# Patient Record
Sex: Female | Born: 1985 | Race: Black or African American | Hispanic: No | Marital: Single | Smoking: Never smoker
Health system: Southern US, Community
[De-identification: ages and names within clinical notes are randomized; demographics above are authoritative.]

## PROBLEM LIST (undated history)

## (undated) DIAGNOSIS — D649 Anemia, unspecified: Secondary | ICD-10-CM

## (undated) DIAGNOSIS — K219 Gastro-esophageal reflux disease without esophagitis: Secondary | ICD-10-CM

## (undated) DIAGNOSIS — N390 Urinary tract infection, site not specified: Secondary | ICD-10-CM

## (undated) DIAGNOSIS — G43909 Migraine, unspecified, not intractable, without status migrainosus: Secondary | ICD-10-CM

## (undated) DIAGNOSIS — F319 Bipolar disorder, unspecified: Secondary | ICD-10-CM

## (undated) DIAGNOSIS — B009 Herpesviral infection, unspecified: Secondary | ICD-10-CM

## (undated) DIAGNOSIS — F32A Depression, unspecified: Secondary | ICD-10-CM

## (undated) DIAGNOSIS — F329 Major depressive disorder, single episode, unspecified: Secondary | ICD-10-CM

## (undated) DIAGNOSIS — R768 Other specified abnormal immunological findings in serum: Secondary | ICD-10-CM

## (undated) HISTORY — DX: Urinary tract infection, site not specified: N39.0

## (undated) HISTORY — DX: Other specified abnormal immunological findings in serum: R76.8

## (undated) HISTORY — DX: Gastro-esophageal reflux disease without esophagitis: K21.9

## (undated) HISTORY — DX: Major depressive disorder, single episode, unspecified: F32.9

## (undated) HISTORY — DX: Depression, unspecified: F32.A

## (undated) HISTORY — DX: Anemia, unspecified: D64.9

## (undated) HISTORY — DX: Morbid (severe) obesity due to excess calories: E66.01

## (undated) HISTORY — DX: Migraine, unspecified, not intractable, without status migrainosus: G43.909

---
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
## 2007-01-02 HISTORY — PX: DILATION AND CURETTAGE OF UTERUS: SHX78

## 2007-01-10 ENCOUNTER — Encounter (INDEPENDENT_AMBULATORY_CARE_PROVIDER_SITE_OTHER): Payer: Self-pay | Admitting: Obstetrics and Gynecology

## 2007-01-10 ENCOUNTER — Ambulatory Visit (HOSPITAL_COMMUNITY): Admission: AD | Admit: 2007-01-10 | Discharge: 2007-01-10 | Payer: Self-pay | Admitting: Obstetrics and Gynecology

## 2008-03-18 ENCOUNTER — Inpatient Hospital Stay (HOSPITAL_COMMUNITY): Admission: AD | Admit: 2008-03-18 | Discharge: 2008-03-18 | Payer: Self-pay | Admitting: Obstetrics and Gynecology

## 2008-04-19 ENCOUNTER — Inpatient Hospital Stay (HOSPITAL_COMMUNITY): Admission: AD | Admit: 2008-04-19 | Discharge: 2008-04-23 | Payer: Self-pay | Admitting: Obstetrics and Gynecology

## 2008-04-20 ENCOUNTER — Encounter (INDEPENDENT_AMBULATORY_CARE_PROVIDER_SITE_OTHER): Payer: Self-pay | Admitting: Obstetrics and Gynecology

## 2008-04-30 ENCOUNTER — Inpatient Hospital Stay (HOSPITAL_COMMUNITY): Admission: AD | Admit: 2008-04-30 | Discharge: 2008-04-30 | Payer: Self-pay | Admitting: Obstetrics and Gynecology

## 2008-05-11 ENCOUNTER — Emergency Department (HOSPITAL_COMMUNITY): Admission: EM | Admit: 2008-05-11 | Discharge: 2008-05-11 | Payer: Self-pay | Admitting: Emergency Medicine

## 2009-08-16 ENCOUNTER — Ambulatory Visit: Payer: Self-pay | Admitting: Family

## 2009-08-16 DIAGNOSIS — F319 Bipolar disorder, unspecified: Secondary | ICD-10-CM

## 2009-08-17 ENCOUNTER — Telehealth (INDEPENDENT_AMBULATORY_CARE_PROVIDER_SITE_OTHER): Payer: Self-pay | Admitting: *Deleted

## 2009-08-18 ENCOUNTER — Telehealth: Payer: Self-pay | Admitting: Family

## 2009-08-18 ENCOUNTER — Encounter: Payer: Self-pay | Admitting: Family

## 2009-08-21 ENCOUNTER — Encounter: Payer: Self-pay | Admitting: Family

## 2009-08-22 ENCOUNTER — Telehealth: Payer: Self-pay | Admitting: Family

## 2009-08-30 ENCOUNTER — Ambulatory Visit: Payer: Self-pay | Admitting: Family

## 2009-08-30 ENCOUNTER — Other Ambulatory Visit: Admission: RE | Admit: 2009-08-30 | Discharge: 2009-08-30 | Payer: Self-pay | Admitting: Internal Medicine

## 2009-08-30 DIAGNOSIS — G43009 Migraine without aura, not intractable, without status migrainosus: Secondary | ICD-10-CM | POA: Insufficient documentation

## 2009-08-30 DIAGNOSIS — K219 Gastro-esophageal reflux disease without esophagitis: Secondary | ICD-10-CM | POA: Insufficient documentation

## 2009-08-30 DIAGNOSIS — K589 Irritable bowel syndrome without diarrhea: Secondary | ICD-10-CM | POA: Insufficient documentation

## 2009-08-30 LAB — HM PAP SMEAR: HM Pap smear: NORMAL

## 2009-09-01 ENCOUNTER — Ambulatory Visit: Payer: Self-pay | Admitting: Gastroenterology

## 2009-09-01 LAB — CONVERTED CEMR LAB
LDL Cholesterol: 78 mg/dL (ref 0–99)
Total CHOL/HDL Ratio: 2

## 2009-09-04 ENCOUNTER — Ambulatory Visit: Payer: Self-pay | Admitting: Psychiatry

## 2009-09-04 ENCOUNTER — Ambulatory Visit: Payer: Self-pay | Admitting: Family

## 2009-09-04 ENCOUNTER — Telehealth (INDEPENDENT_AMBULATORY_CARE_PROVIDER_SITE_OTHER): Payer: Self-pay | Admitting: *Deleted

## 2009-09-04 DIAGNOSIS — J020 Streptococcal pharyngitis: Secondary | ICD-10-CM

## 2009-09-04 LAB — CONVERTED CEMR LAB
BUN: 7 mg/dL (ref 6–23)
Basophils Absolute: 0 10*3/uL (ref 0.0–0.1)
Basophils Relative: 0.7 % (ref 0.0–3.0)
CO2: 27 meq/L (ref 19–32)
Chloride: 101 meq/L (ref 96–112)
Creatinine, Ser: 0.5 mg/dL (ref 0.4–1.2)
Eosinophils Absolute: 0.2 10*3/uL (ref 0.0–0.7)
Glucose, Bld: 86 mg/dL (ref 70–99)
Lymphocytes Relative: 39.3 % (ref 12.0–46.0)
MCHC: 32.7 g/dL (ref 30.0–36.0)
Monocytes Absolute: 0.5 10*3/uL (ref 0.1–1.0)
Neutrophils Relative %: 49.6 % (ref 43.0–77.0)
Platelets: 251 10*3/uL (ref 150.0–400.0)
RBC: 4.97 M/uL (ref 3.87–5.11)
RDW: 17.8 % — ABNORMAL HIGH (ref 11.5–14.6)
Sodium: 133 meq/L — ABNORMAL LOW (ref 135–145)
TSH: 0.85 microintl units/mL (ref 0.35–5.50)

## 2009-09-05 LAB — CONVERTED CEMR LAB

## 2009-09-06 ENCOUNTER — Encounter: Payer: Self-pay | Admitting: Family

## 2009-09-25 ENCOUNTER — Ambulatory Visit: Payer: Self-pay | Admitting: Psychiatry

## 2009-10-02 ENCOUNTER — Ambulatory Visit: Payer: Self-pay | Admitting: Psychiatry

## 2009-10-18 ENCOUNTER — Ambulatory Visit: Payer: Self-pay | Admitting: Gastroenterology

## 2009-10-25 ENCOUNTER — Telehealth: Payer: Self-pay | Admitting: Family

## 2009-11-22 HISTORY — PX: INDUCED ABORTION: SHX677

## 2009-11-27 ENCOUNTER — Ambulatory Visit: Payer: Self-pay | Admitting: Family

## 2009-12-13 ENCOUNTER — Telehealth: Payer: Self-pay | Admitting: Family

## 2009-12-15 ENCOUNTER — Encounter (INDEPENDENT_AMBULATORY_CARE_PROVIDER_SITE_OTHER): Payer: Self-pay | Admitting: *Deleted

## 2009-12-15 ENCOUNTER — Ambulatory Visit: Payer: Self-pay | Admitting: Internal Medicine

## 2009-12-15 LAB — CONVERTED CEMR LAB
Bilirubin Urine: NEGATIVE
Nitrite: NEGATIVE
Specific Gravity, Urine: 1.01
WBC Urine, dipstick: NEGATIVE
pH: 7.5

## 2010-01-17 ENCOUNTER — Encounter: Payer: Self-pay | Admitting: Family

## 2010-01-31 ENCOUNTER — Telehealth: Payer: Self-pay | Admitting: Family

## 2010-02-12 ENCOUNTER — Telehealth: Payer: Self-pay | Admitting: Family

## 2010-02-16 ENCOUNTER — Ambulatory Visit: Payer: Self-pay | Admitting: Family

## 2010-02-16 DIAGNOSIS — N390 Urinary tract infection, site not specified: Secondary | ICD-10-CM

## 2010-02-16 LAB — CONVERTED CEMR LAB
Glucose, Urine, Semiquant: NEGATIVE
Protein, U semiquant: 30
pH: 6

## 2010-02-17 ENCOUNTER — Encounter: Payer: Self-pay | Admitting: Family

## 2010-02-17 LAB — CONVERTED CEMR LAB: Gardnerella vaginalis: POSITIVE — AB

## 2010-02-19 ENCOUNTER — Telehealth: Payer: Self-pay | Admitting: Family

## 2010-04-23 ENCOUNTER — Ambulatory Visit: Payer: Self-pay | Admitting: Family

## 2010-04-23 DIAGNOSIS — J029 Acute pharyngitis, unspecified: Secondary | ICD-10-CM

## 2010-04-23 LAB — CONVERTED CEMR LAB
Inflenza A Ag: NEGATIVE
Rapid Strep: NEGATIVE

## 2010-07-03 NOTE — Progress Notes (Signed)
Summary: birth control questions  Phone Note Call from Patient Call back at [facility] # [phone]   Caller: Patient Call For: Lemont Fillers FNP Summary of Call: Received call from pt stating she can't remember her last menstrual cycle. Thinks it has been about 2 months. Wants to know if this is normal to not have any bleeding while on oral contraceptive? Melanie Christian CMA Duncan Dull)  February 12, 2010 3:29 PM   Follow-up for Phone Call        She should bleed during the last week in the pack (week 4).  Please schedule a nurse visit for urine HCG. Follow-up by: Lemont Fillers FNP,  February 12, 2010 3:49 PM  Additional Follow-up for Phone Call Additional follow up Details #1::        Advised pt per [facility] instruction. Pt scheduled nurse visit for 02/16/10 @ 1:30pm. Mervin Kung CMA (AAMA)  February 12, 2010 4:26 PM

## 2010-07-03 NOTE — Progress Notes (Signed)
Summary: change birth control  Phone Note Call from Patient Call back at [phone]   Caller: Patient Call For: Lemont Fillers FNP Summary of Call: Pt states she was told in the past that she should not take a hormonal birth control because of possible effects on migraines. States that she is taking Excedrin Migraine every day and it usually helps but is not helping today.  Also states that she had an elevated BP on "Sunday of 130/90 adn was feeling dizzy.  Should she change birth control?  Please advise.  Tricia Fergerson CMA (AAMA)  December 13, 2009 11:21 AM   Follow-up for Phone Call        Called patient, she has had migraine since Sunday- Excedrin Migraine is not working.  Will try imitrex.  Pt instructed to call if this does not improve her HA as she will need to be seen in the office.  She had mirena IUD in the past but she was intolerant to it and it was removed.  Continue OCP for now. Follow-up by: Imran Nuon S O'Sullivan FNP,  December 13, 2009 11:50 AM    New/Updated Medications: IMITREX 50 MG TABS (SUMATRIPTAN SUCCINATE) one tablet by mouth now, may repeat in 2 hours x one additional dose if HA returns. Prescriptions: IMITREX 50 MG TABS (SUMATRIPTAN SUCCINATE) one tablet by mouth now, may repeat in 2 hours x one additional dose if HA returns.  #6 x 0   Entered and Authorized by:   Rhyan Radler S O'Sullivan FNP   Signed by:   Jenene Kauffmann S O'Sullivan FNP on 12/13/2009   Method used:   Electronically to        [facility] [address] (retail)       [address]       [place], [place]  [id number]       Ph: [phone]       Fax: [phone]   RxID:   [id number]

## 2010-07-03 NOTE — Progress Notes (Signed)
Summary: Lo loestrin alternative  Phone Note Call from Patient   Caller: Patient Call For: Lemont Fillers FNP Summary of Call: Received voice message from pt stating that her insurance will no longer cover lo loestrin. Pt would like [place] to prescribe something else with a low hormone level. Please advise. Nicki Guadalajara Fergerson CMA Duncan Dull)  January 31, 2010 4:20 PM   Follow-up for Phone Call        I spoke to [place] with [facility], he verified that med is not covered but he was not able to tell me other covered alternatives. Please advise. Nicki Guadalajara Fergerson CMA Duncan Dull)  January 31, 2010 4:24 PM   Additional Follow-up for Phone Call Additional follow up Details #1::        She can try micrgestin 1/20.  This is the lowest generic that I am aware of.  Sent to pharmacy. Additional Follow-up by: Lemont Fillers FNP,  January 31, 2010 4:28 PM    Additional Follow-up for Phone Call Additional follow up Details #2::    Pt advised of new med. and will call if any problems. Nicki Guadalajara Fergerson CMA Duncan Dull)  February 01, 2010 8:38 AM   New/Updated Medications: MICROGESTIN FE 1/20 1-20 MG-MCG TABS (NORETHIN ACE-ETH ESTRAD-FE) take as directed Prescriptions: MICROGESTIN FE 1/20 1-20 MG-MCG TABS (NORETHIN ACE-ETH ESTRAD-FE) take as directed  #1 x 5   Entered and Authorized by:   Lemont Fillers FNP   Signed by:   Lemont Fillers FNP on 01/31/2010   Method used:   Electronically to        [facility] [address] (retail)       [address]       [place], [place]  [address]       Ph: [phone]       Fax: [phone]   RxID:   [id number]

## 2010-07-03 NOTE — Medication Information (Signed)
Summary: Prior Authorization for Cymbalta/[employer]  Prior Authorization for Cymbalta/[employer]   Imported By: Lanelle Bal 08/23/2009 09:52:37  _____________________________________________________________________  External Attachment:    Type:   Image     Comment:   External Document

## 2010-07-03 NOTE — Progress Notes (Signed)
Summary: pregnancy kit  Phone Note Call from Patient Call back at [phone]   Caller: Patient Summary of Call: Pt called stating she was 1 week overdue for her menstrual cycle.  Wanted to know when she should do a pregnancy test.  Per Gracey Tolle, pt can do a test now; should detect pregnancy if she is 1 day overdue for her cycle. Notified pt.  Mervin Kung CMA  Oct 25, 2009 3:53 PM

## 2010-07-03 NOTE — Progress Notes (Signed)
Summary: Cymbalta prior auth  Phone Note Call from Patient   Caller: Patient Summary of Call: Pt. states that her pharmacy will not give her med. that Melissa prescribed until we call insurance for a prior authorization. Call pt. [phone] Pt. has no medicine to take and was seen yesterday Initial call taken by: Michaelle Copas,  August 17, 2009 3:50 PM  Follow-up for Phone Call        call placed to patient at [phone], no answer, voice message left informing patient that samples would left at front desk for patient pick up until we hear from her pharmacy regarding prior authorization Follow-up by: Glendell Docker CMA,  August 17, 2009 4:31 PM  Additional Follow-up for Phone Call Additional follow up Details #1::        her pharmacy is stating that they faxed a prior authorization request yesterday at 3:26.. Pt would like for [place] to get this resolved so she doesn't have to go back and forth between [place]. Pt. also like for this to go ahead be sent to her pharmacy because that is closer for her to get med because our office is further from her home.Call pt. back and let her know status on this. Thank you Additional Follow-up by: Michaelle Copas,  August 17, 2009 4:37 PM    Additional Follow-up for Phone Call Additional follow up Details #2::    Spoke to pt. and advised her we have samples she can pick up at the [facility] office today until medication is approved with the insurance.  Form was completed and faxed to [facility]. today. Follow-up by: Mervin Kung CMA,  August 18, 2009 10:55 AM

## 2010-07-03 NOTE — Letter (Signed)
Summary: Primary Care Consult Scheduled Letter  Sharon at [facility]  [address]   [place], [place] [address]   Phone: [phone]  Fax: [phone]      12/15/2009 MRN: [id number]  Melanie Christian [address] [place], [place]  [address]    Dear Ms. Panther,      We have scheduled an appointment for you.  At the recommendation of Dr.YOO, we have scheduled you a consult with DR Mitzi Davenport on January 17, 2010 at 2:15PM .  Their address is_[address] ,[place] . The office phone number is [phone].  If this appointment day and time is not convenient for you, please feel free to call the office of the doctor you are being referred to at the number listed above and reschedule the appointment.     It is important for you to keep your scheduled appointments. We are here to make sure you are given good patient care. If you have questions or you have made changes to your appointment, please notify us at  [phone], ask for HELEN.    Thank you,  Patient Care Coordinator [place] at [facility]

## 2010-07-03 NOTE — Letter (Signed)
Summary: Out of Work  Adult nurse at [facility]. [address]   [place], [place] [address]   Phone: [phone]  Fax: [phone]    September 04, 2009   Employee:  MICKEY HEBEL    To Whom It May Concern:   For Medical reasons, please excuse the above named employee from work for the following dates:  Start:   4/4  End:   4/7  If you need additional information, please feel free to contact our office.         Sincerely,    Lemont Fillers FNP

## 2010-07-03 NOTE — Assessment & Plan Note (Signed)
Summary: SORE THROAT LARINGITIS/MHF   Vital Signs:  Patient profile:   25 year old female Height:      62 inches Weight:      253.75 pounds BMI:     46.58 Temp:     98.6 degrees F oral Pulse rate:   84 / minute Pulse rhythm:   regular Resp:     18 per minute BP sitting:   110 / 78  (right arm) Cuff size:   large  Vitals Entered By: Mervin Kung CMA Duncan Dull) (April 23, 2010 8:33 AM) CC: Pt states she has sore throat x 1 week and getting worse. Has cough x 3 days, neck and arms hurt.  Is Patient Diabetic? No Comments Pt states psychologist stopped Amitriptyline and started her on ?? Pt also taking Vyvanse 60mg  1 once daily. Pt completed cipro and Metrogel. Nicki Guadalajara Fergerson CMA Duncan Dull)  April 23, 2010 8:43 AM    Primary Care Provider:  Tonie Griffith FNP  CC:  Pt states she has sore throat x 1 week and getting worse. Has cough x 3 days and neck and arms hurt. .  History of Present Illness: Patient presents with chief complaint of sore throat.  Onset of symptoms was 1 week ago.  Course has been gradual with initial "scratchy" throat, and now occurs in a constant pattern.  Symptoms are associated with myalgia and dry cough.  She denies sick contacts.  Pt did not have a flu shot this year.  Symptoms are worsened by nothing, symptoms are improved by nothing.   Depression-  see Dr. Otelia Santee, depression is improved,  develops nausea with her medications.  Sometimes doesn't take meds due to nausea.     Allergies: 1)  ! Vicodin 2)  ! Percocet  Past History:  Past Medical History: Last updated: 12/15/2009 anemia Depression  Migraines Gerd UTI morbid obesity  Past Surgical History: Last updated: 12/15/2009 Tonsillectomy--1996 C-Section--11-09 D&C-- 8/08 Elective Abortion--11/22/09   Review of Systems       see HPI Denies ear pain or nasal congestion.  Physical Exam  General:  Well-developed,well-nourished,in no acute distress; alert,appropriate and cooperative  throughout examination Head:  Normocephalic and atraumatic without obvious abnormalities. No apparent alopecia or balding. Eyes:  No corneal or conjunctival inflammation noted. EOMI. Perrla. Funduscopic exam benign, without hemorrhages, exudates or papilledema. Vision grossly normal. Ears:  L TM is dull, no bulging Mouth:  Oral mucosa and oropharynx without lesions or exudates.  Teeth in good repair.  + pharyngeal erythema.   Lungs:  Normal respiratory effort, chest expands symmetrically. Lungs are clear to auscultation, no crackles or wheezes. Heart:  Normal rate and regular rhythm. S1 and S2 normal without gallop, murmur, click, rub or other extra sounds. Cervical Nodes:  + LAD Psych:  Cognition and judgment appear intact. Alert and cooperative with normal attention span and concentration. No apparent delusions, illusions, hallucinations   Impression & Recommendations:  Problem # 1:  PHARYNGITIS, ACUTE (ICD-462) Assessment Comment Only  Rapid strep is negative, but will plan to treat empirically for strep given severity of throat pain and LAD.  Flu swab was negative.   The following medications were removed from the medication list:    Cipro 500 Mg Tabs (Ciprofloxacin hcl) ..... One tablet by mouth two times a day x 7 days Her updated medication list for this problem includes:    Amoxicillin 500 Mg Caps (Amoxicillin) .Marland Kitchen... 2 caps by mouth two times a day for 10 days  The following medications were  removed from the medication list:    Cipro 500 Mg Tabs (Ciprofloxacin hcl) ..... One tablet by mouth two times a day x 7 days Her updated medication list for this problem includes:    Amoxicillin 500 Mg Caps (Amoxicillin) .Marland Kitchen... 2 caps by mouth two times a day for 10 days  Problem # 2:  DEPRESSION (ICD-311) Assessment: Improved Overall improved.  This is being managed by psychiatry.   The following medications were removed from the medication list:    Amitriptyline Hcl 10 Mg Tabs  (Amitriptyline hcl) ..... One by mouth qpm  Complete Medication List: 1)  Excedrin Migraine 250-250-65 Mg Tabs (Aspirin-acetaminophen-caffeine) .... As needed 2)  Nexium 40 Mg Cpdr (Esomeprazole magnesium) .Marland Kitchen.. 1 by mouth once daily, 20-30 min before dinner meal 3)  Microgestin Fe 1/20 1-20 Mg-mcg Tabs (Norethin ace-eth estrad-fe) .... Take as directed 4)  Frova 2.5 Mg Tabs (Frovatriptan succinate) .... One by mouth once daily as needed for severe migraine 5)  Vyvanse 60 Mg Caps (Lisdexamfetamine dimesylate) .... Take 1 capsule by mouth once a day. 6)  Amoxicillin 500 Mg Caps (Amoxicillin) .... 2 caps by mouth two times a day for 10 days 7)  Effexor Ir 75mg   .... Take 1 tablet by mouth once a day.  Other Orders: Rapid Strep ([address]) Flu A+B ([address])  Patient Instructions: 1)  Take 400-600mg  of Ibuprofen (Advil, Motrin) with food every 4-6 hours as needed for relief of pain or comfort of fever. 2)  Gargle twice daily with salt water. 3)  Take Tylenol 650mg  every 6 hours as needed for pain 4)  You may use over the counter Cepacol lozenges or Chloraseptic spray as needed for sore throat. 5)  Call if symptoms worsen or do not improve in 48 hours.   6)  Use condoms for back up while you are on amoxicillin and for 2 weeks after.   Prescriptions: AMOXICILLIN 500 MG CAPS (AMOXICILLIN) 2 caps by mouth two times a day for 10 days  #40 x 0   Entered and Authorized by:   Lemont Fillers FNP   Signed by:   Lemont Fillers FNP on 04/23/2010   Method used:   Electronically to        [facility] (retail)       [address]       [place], [place]  [id number]       Ph: [phone]       Fax: [phone]   RxID:   [id number]    Orders Added: 1)  Rapid Strep [[address]] 2)  Flu A+B [[address]] 3)  Est. Patient Level IV [03474]    Current Allergies (reviewed today): ! VICODIN ! PERCOCET  Laboratory Results   Date/Time Reported: Mervin Kung CMA Duncan Dull)   April 23, 2010 8:57 AM   Other Tests  Rapid Strep: negative Influenza A: negative Influenza B: negative  Kit Test Internal QC: Positive   (Normal Range: Negative)

## 2010-07-03 NOTE — Assessment & Plan Note (Signed)
  Review of gastrointestinal problems: 1. IBS-like alternating constipation diarrhea. Microcytic, but not anemic; complete metabolic profile normal (labs April, 2011) 2. nausea, dyspepsia, possibly GERD related. 3. Morbid obesity, likely contributes to GI symptoms   History of Present Illness Visit Type: Follow-up Visit Primary GI MD: Rob Bunting MD Primary Provider: Tonie Griffith FNP Requesting Provider: Tonie Griffith FNP Chief Complaint: N&V, diarrhea & constipation History of Present Illness:     pleasant 25 year old woman whom I last saw about 6 weeks ago. Since then she didn't try fiber supplement that I recommended.  instead, she [place], this improved things, when she stopped bowels got bad again. She stopped because of the taste.  No blood in her stool.  When she was on nexium samples, definitely improved pyrosis symptoms.  She is eating smaller, more frequent meals now.             Current Medications (verified): 1)  Excedrin Migraine 250-250-65 Mg Tabs (Aspirin-Acetaminophen-Caffeine) .... As Needed 2)  Cymbalta 30 Mg Cpep (Duloxetine Hcl) .... 2 Tablets By Mouth At Bedtime 3)  Nexium 40 Mg Cpdr (Esomeprazole Magnesium) .Marland Kitchen.. 1 By Mouth Once Daily  Allergies (verified): 1)  ! Vicodin 2)  ! Percocet  Vital Signs:  Patient profile:   25 year old female Height:      62 inches Weight:      258 pounds BMI:     47.36 Pulse rate:   56 / minute Pulse rhythm:   regular BP sitting:   100 / 60  (right arm) Cuff size:   large  Vitals Entered By: June McMurray CMA Duncan Dull) (Oct 18, 2009 1:53 PM)  Physical Exam  Additional Exam:  Constitutional: generally well appearing Psychiatric: alert and oriented times 3 Abdomen: soft, non-tender, non-distended, normal bowel sounds    Impression & Recommendations:  Problem # 1:  GERD her symptoms are well-controlled on Nexiumonce daily. I will call her any prescription for this. She has no alarm symptoms to  necessitate endoscopy.  Problem # 2:  alternating constipation diarrhea likely IBS. I again reiterated that she try Citrucel on a daily basis. She knows to call if this is not helpful or if new symptoms arise.  Patient Instructions: 1)  Prescription for nexium called in.  Best taken 20-30 min before dinner meal. 2)  You should begin taking citrucel powder fiber supplement (orange flavor).  Start with a small spoonful and increase this over 1 week to a full, heaping spoonful daily.  You may notice some bloating when you first start the fiber, but that usually resolves after a few days. 3)  Return to see Dr. Christella Hartigan as needed. 4)  The medication list was reviewed and reconciled.  All changed / newly prescribed medications were explained.  A complete medication list was provided to the patient / caregiver. Prescriptions: NEXIUM 40 MG CPDR (ESOMEPRAZOLE MAGNESIUM) 1 by mouth once daily, 20-30 min before dinner meal  #30 x 11   Entered and Authorized by:   Rachael Fee MD   Signed by:   Rachael Fee MD on 10/18/2009   Method used:   Electronically to        [facility] [address] (retail)       [address]       [place], [place]  [address]       Ph: [phone]       Fax: [phone]   RxID:   [id number]

## 2010-07-03 NOTE — Consult Note (Signed)
Summary: Nadyne Coombes MD Ophthalmology  Nadyne Coombes MD Ophthalmology   Imported By: Lanelle Bal 02/02/2010 12:05:14  _____________________________________________________________________  External Attachment:    Type:   Image     Comment:   External Document

## 2010-07-03 NOTE — Assessment & Plan Note (Signed)
Summary: NEW TO EST/MHF   Vital Signs:  Patient profile:   25 year old female Height:      62 inches Weight:      258 pounds BMI:     47.36 O2 Sat:      98 % on Room air Temp:     98.3 degrees F oral Pulse rate:   103 / minute Pulse rhythm:   regular Resp:     12 per minute BP sitting:   100 / 60  (right arm) Cuff size:   large  Vitals Entered By: Mervin Kung CMA (August 16, 2009 1:44 PM)  O2 Flow:  Room air Is Patient Diabetic? No Comments Pt would like to restart or change depression medication.   History of Present Illness: Patient presents today to establish care.  Has not had a PCP since middle school.  Depression-  Tells me that she saw a Child psychotherapist monday of this week.  Tells me that she had history of depression during her  last pregnancy.  She was treated with prozac during that time.  Notes that she is having increasing crying spells/anger recently.  Patient has a 43 month old son.  Notes that she has had a lot of stress lately.  Patient notes that she has had a history of suicide ideation.  Last time she had a thought like this was on Saturday.  Was "crying a lot"  had thought of cutting herself, "but I knew that I wouldn't do it." Denies current suicide ideation.  Notes poor sleep pattern.  Sometimes doesn't  eat all day, then binges at night.    Preventive Screening-Counseling & Management  Alcohol-Tobacco     Alcohol drinks/day: <1     Alcohol type: wine     Smoking Status: never  Caffeine-Diet-Exercise     Caffeine use/day: 1 cup weekly  Allergies (verified): 1)  ! Vicodin 2)  ! Percocet  Past History:  Past Medical History: anemia Depression Migraines Gertd UTI  Past Surgical History: Tonsillectomy--1996 C-Section--11-09 D&C-- 8/08  Family History: Drug Problem--father Arthritis--mother, maternal grandmother Hypercholesterolemia--mother HTN--mother, father,  paternal grandmother Diabetes-- maternal grandmother Stomach cancer--  maternal grandmother  Dad- depression,  PTSD from [place] Sister- PCO Mom- HTN, High cholesterol 1/2 sister- sickle cell trait, MS (age 73)  Social History: Alcohol use-yes (2-3 glasses of mixed drinks/vodka a day when depressed) Drugs- +marijuana use no tobacco 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  Smoking Status:  never Caffeine use/day:  1 cup weekly  Physical Exam  General:  Obese african [place] female in NAD Neck:  No deformities, masses, or tenderness noted. Lungs:  Normal respiratory effort, chest expands symmetrically. Lungs are clear to auscultation, no crackles or wheezes. Heart:  Normal rate and regular rhythm. S1 and S2 normal without gallop, murmur, click, rub or other extra sounds. Psych:  Flat affect, not tearful during exam, calm and appropriate. not agitated, not suicidal   Impression & Recommendations:  Problem # 1:  DEPRESSION (ICD-311) Assessment Deteriorated  I discussed this case in detail with Dr. Darryll Capers.  Will plan trial of cymbalta as patient did not have significant improvement on fluoxetine in the past.  Patient will be referred for counselling at behavioral health and was instructed to call on Friday and speak with Nicki Guadalajara (my CMA) to let her know how she is feeling.  I instructed patient to go directly to the ER or behavioral health if she develops worsening depressive symptoms or if  she develops suicide ideation.  She verbalizes understanding of this verbal contract and promises me that she will do so.    Her updated medication list for this problem includes:    Cymbalta 30 Mg Cpep (Duloxetine hcl) ..... One tablet by mouth daily for 1 week, then increase to one tablet by mouth two times a day  Orders: Psychology Referral (Psychology)  Complete Medication List: 1)  Vita-natal Caps (Prenatal multivit-min-fe-fa) .... As needed 2)  Excedrin Migraine 250-250-65 Mg Tabs (Aspirin-acetaminophen-caffeine) .... As needed 3)  Acai  Berry 500 Mg Caps (Acai) .... As needed 4)  Cymbalta 30 Mg Cpep (Duloxetine hcl) .... One tablet by mouth daily for 1 week, then increase to one tablet by mouth two times a day  Patient Instructions: 1)  Go directly to the Emergency room if you develop thoughts of suicide or of hurting others.   2)  Call on Friday and let nurse know how you are feeling. 3)  Please follow up in 2 weeks for a complete physical with PAP 4)  You will be contacted about your referral to see a therapist. Prescriptions: CYMBALTA 30 MG CPEP (DULOXETINE HCL) one tablet by mouth daily for 1 week, then increase to one tablet by mouth two times a day  #60 x 0   Entered and Authorized by:   Lemont Fillers FNP   Signed by:   Lemont Fillers FNP on 08/16/2009   Method used:   Electronically to        [facility] [address] (retail)       [address]       [place], [place]  [id number]       Ph: [phone]       Fax: [phone]   RxID:   [id number]    Vital Signs:  Patient Profile:   25 year old female Height:     62 inches Weight:      258 pounds BMI:     47.36 O2 Sat:      98 % Temp:     98.3 degrees F oral Pulse rate:   103 / minute Pulse rhythm:   regular Resp:     12 per minute BP sitting:   100 / 60 Cuff size:   large                  Preventive Care Screening  Pap Smear:    Date:  02/02/2008    Results:  normal   Last Tetanus Booster:    Date:  10/01/2005    Results:  Historical    Current Allergies (reviewed today): ! VICODIN ! PERCOCET

## 2010-07-03 NOTE — Progress Notes (Signed)
Summary: [facility] CASE [id number] FOR CYMBALTA  Phone Note Outgoing Call   Call placed by: [facility] Call placed to: [facility] Summary of Call: [facility] CASE [id number] FOR PRIOR AUTH ON CYMBALTA  Initial call taken by: Roselle Locus,  August 18, 2009 9:05 AM  Follow-up for Phone Call        FORM [facility] AND FAXED TO Zaidee Rion AT [employer] [employer] Roselle Locus  August 18, 2009 9:30 AM  prior auth form completed & faxed back to [facility]  August 18, 2009 10:21 AM   Additional Follow-up for Phone Call Additional follow up Details #1::        Called  patient to follow up on her depression.  She tells me that she started cymbalta on saturday.  Has not yet noted significant improvement, but denies active suicide ideation.  She tells me that she has been vomitting since the AM.  Felt sick yesterday.  I advised patient to drink plenty of fluids and to call us if her symptoms worsen or do not improve in the next 24 hours. Additional Follow-up by: Lemont Fillers FNP,  August 21, 2009 2:31 PM

## 2010-07-03 NOTE — Assessment & Plan Note (Signed)
History of Present Illness Visit Type: consult  Primary GI MD: Rob Bunting MD Primary Provider: Tonie Griffith FNP Requesting Provider: Tonie Griffith FNP Chief Complaint: IBS  History of Present Illness:     25 year old woman who is concerned about her "digestion." she can have nausea at times.  She had fecal incontinence once last month. She has a lot of urgency.    Nausea occurs daily, usually when she is hungry and the nausea continues after eating.  She wakes up with nausea and will vomit at times.    She has pyrosis, used to be every day.  Takes an otc antiacid.   The pyroisis was worse when she was pregnant.  The pill doesn't help anyway.    She alternate between constipation and diarrhea.  Will take immodium at times, pepto at times.  Both can help.  She has seen blood on TP, at times she has to strain, push with constipation.           Current Medications (verified): 1)  Vita-Natal  Caps (Prenatal Multivit-Min-Fe-Fa) .... As Needed 2)  Excedrin Migraine 250-250-65 Mg Tabs (Aspirin-Acetaminophen-Caffeine) .... As Needed 3)  Acai Berry 500 Mg Caps (Acai) .... As Needed 4)  Cymbalta 30 Mg Cpep (Duloxetine Hcl) .... 2 Tablets By Mouth At Bedtime 5)  Nexium 40 Mg Cpdr (Esomeprazole Magnesium) .Marland Kitchen.. 1 By Mouth Once Daily  Allergies (verified): 1)  ! Vicodin 2)  ! Percocet  Past History:  Past Medical History: anemia Depression Migraines Gertd UTI morbid obesity  Past Surgical History: Tonsillectomy--1996 C-Section--11-09 D&C-- 8/08    Family History: Drug Problem--father Arthritis--mother, maternal grandmother Hypercholesterolemia--mother HTN--mother, father,  paternal grandmother Diabetes-- maternal grandmother Stomach cancer-- maternal grandmother Dad- depression,  PTSD from [place] Sister- PCO Mom- HTN, High cholesterol 1/2 sister- sickle cell trait, MS (age 71)  Social History: Alcohol use-yes (2-3 glasses of mixed drinks/vodka a  day when depressed) Drugs- +marijuana use no tobacco 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     Review of Systems       Pertinent positive and negative review of systems were noted in the above HPI and GI specific review of systems.  All other review of systems was otherwise negative.   Vital Signs:  Patient profile:   25 year old female Height:      62 inches Weight:      256 pounds BMI:     46.99 BSA:     2.12 Pulse rate:   88 / minute Pulse rhythm:   regular BP sitting:   110 / 64  (left arm) Cuff size:   large  Vitals Entered By: Ok Anis CMA (September 01, 2009 3:04 PM)  Physical Exam  Additional Exam:  Constitutional: Morbidly obese otherwise  well appearing Psychiatric: alert and oriented times 3 Eyes: extraocular movements intact Mouth: oropharynx moist, no lesions Neck: supple, no lymphadenopathy Cardiovascular: heart regular rate and rythm Lungs: CTA bilaterally Abdomen: soft, non-tender, non-distended, no obvious ascites, no peritoneal signs, normal bowel sounds Extremities: no lower extremity edema bilaterally Skin: no lesions on visible extremities    Impression & Recommendations:  Problem # 1:  irritable bowel-like symptoms she alternates between constipation and diarrhea. Usually this is helped with fiber supplements which I have recommended she try.  Problem # 2:  nausea, pyrosis I suspect she has issues with GERD. This can cause nausea as well as her pyrosis. She is morbidly obese and this must contribute to her symptoms.  I have given her samples of Nexium which she'll take once daily and she'll return to see me in 6 weeks to report on her symptoms she will also get a basic set of labs including a CBC, complete metabolic profile, thyroid testing.  Other Orders: TLB-CBC Platelet - w/Differential (85025-CBCD) TLB-TSH (Thyroid Stimulating Hormone) (84443-TSH) TLB-BMP (Basic Metabolic Panel-BMET) (80048-METABOL)  Patient  Instructions: 1)  Samples of nexium given.  Take one pill 20-30 before first meal of the day. 2)  Try eating smaller, more frequent meal.  Eating one large meal a day is tough on your stomach. 3)  You should begin taking citrucel powder fiber supplement (orange flavor).  Start with a small spoonful and increase this over 1 week to a full, heaping spoonful daily.  You may notice some bloating when you first start the fiber, but that usually resolves after a few days. 4)  You will get lab test(s) done today (cbc, tsh, bmet). 5)  Please schedule a follow-up appointment in 6 to 8 weeks.  6)  A copy of this information will be sent to [facility]. 7)  The medication list was reviewed and reconciled.  All changed / newly prescribed medications were explained.  A complete medication list was provided to the patient / caregiver.

## 2010-07-03 NOTE — Letter (Signed)
   [place] [facility] [address] [place], [place]  [address]  [place] Phone: [phone]      September 06, 2009   [facility] [address] [place], [place] [address]  RE:  LAB RESULTS  Dear  Ms. Blackie,  The following is an interpretation of your most recent lab tests.  Please take note of any instructions provided or changes to medications that have resulted from your lab work.  Pap Smear: normal     Sincerely Yours,    Lemont Fillers FNP

## 2010-07-03 NOTE — Assessment & Plan Note (Signed)
Summary: migraine Melissa said if meds did not work to come in/mhf   Vital Signs:  Patient profile:   25 year old female Height:      62 inches Weight:      259 pounds BMI:     47.54 O2 Sat:      98 % on Room air Temp:     98.5 degrees F oral Pulse rate:   100 / minute Pulse rhythm:   regular Resp:     18 per minute BP sitting:   110 / 70  (right arm) Cuff size:   large  Vitals Entered By: Glendell Docker CMA (December 15, 2009 1:16 PM)  O2 Flow:  Room air CC: Rm 3- Migraine Is Patient Diabetic? No Pain Assessment Patient in pain? no       Does patient need assistance? Functional Status Self care Ambulation Normal   Primary Care Provider:  Tonie Griffith FNP  CC:  Rm 3- Migraine.  History of Present Illness: 25 y/o complains of worsening headaches x 1 week she has hx of migraines previously seen by neurologist (headache center) during Freshman year in college prev MRI of brain reported normal July - 2007 IMPRESSION:   Normal examination.  No cause of headache identified.  No pituitary   lesion.  tried topamax (experienced sided effects - couldn't swallow, lost too much wt) headaches are left sided and around her eye throbbing sensation nausea and photophobia everyday this week no specific trigger but worse since starting birth control   depression - waiting for pysch referral but stopped cymbalta due to nausea  Allergies: 1)  ! Vicodin 2)  ! Percocet  Past History:  Past Medical History: anemia Depression  Migraines Gerd UTI morbid obesity  Past Surgical History: Tonsillectomy--1996 C-Section--11-09 D&C-- 8/08 Elective Abortion--11/22/09   Family History: Drug Problem--father Arthritis--mother, maternal grandmother Hypercholesterolemia--mother HTN--mother, father,  paternal grandmother Diabetes-- maternal grandmother Stomach cancer-- maternal grandmother Dad- depression,  PTSD from [place] Sister- PCO Mom- HTN, High cholesterol 1/2  sister- sickle cell trait, MS (age 48)   Social History: Alcohol use-yes (2-3 glasses of mixed drinks/vodka a day when depressed) Drugs- +marijuana use no tobacco  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     Physical Exam  General:  alert, well-developed, and well-nourished.   Lungs:  normal respiratory effort, normal breath sounds, no crackles, and no wheezes.   Heart:  normal rate, regular rhythm, no murmur, and no gallop.   Neurologic:  alert & oriented X3, cranial nerves II-XII intact, gait normal, and DTRs symmetrical and normal.   Psych:  normally interactive, good eye contact, not anxious appearing, and not depressed appearing.     Impression & Recommendations:  Problem # 1:  COMMON MIGRAINE (ICD-346.10) 25 y/o with worsening migraines.  use frova and nsaids together.  trial of amitriptyline for HA prophylaxis.   refer to ophthalmologist for thorough funduscopic eye exam  Her updated medication list for this problem includes:    Excedrin Migraine 250-250-65 Mg Tabs (Aspirin-acetaminophen-caffeine) .Marland Kitchen... As needed    Frova 2.5 Mg Tabs (Frovatriptan succinate) ..... One by mouth once daily as needed for severe migraine  Orders: Ophthalmology Referral (Ophthalmology)  Complete Medication List: 1)  Excedrin Migraine 250-250-65 Mg Tabs (Aspirin-acetaminophen-caffeine) .... As needed 2)  Nexium 40 Mg Cpdr (Esomeprazole magnesium) .Marland Kitchen.. 1 by mouth once daily, 20-30 min before dinner meal 3)  Lo Loestrin Fe 1 Mg-10 Mcg / 10 Mcg Tabs (Norethin-eth estrad-fe biphas) .... Take as  directed 4)  Frova 2.5 Mg Tabs (Frovatriptan succinate) .... One by mouth once daily as needed for severe migraine 5)  Amitriptyline Hcl 10 Mg Tabs (Amitriptyline hcl) .... One by mouth qpm  Other Orders: UA Dipstick w/o Micro (manual) (93235)  Patient Instructions: 1)  Please follow up with opthalmologist for eye exam.   2)  Take 600 mg of ibuprofen together with frova 2.5 mg 3)   Please schedule a follow-up appointment in 2 weeks with Sandford Craze 4)  Call our office if your symptoms do not  improve or gets worse. Prescriptions: AMITRIPTYLINE HCL 10 MG TABS (AMITRIPTYLINE HCL) one by mouth qpm  #30 x 0   Entered and Authorized by:   D. Thomos Lemons DO   Signed by:   D. Thomos Lemons DO on 12/15/2009   Method used:   Print then Give to Patient   RxID:   [id number] FROVA 2.5 MG TABS (FROVATRIPTAN SUCCINATE) one by mouth once daily as needed for severe migraine  #12 x 0   Entered and Authorized by:   D. Thomos Lemons DO   Signed by:   D. Thomos Lemons DO on 12/15/2009   Method used:   Print then Give to Patient   RxID:   [id number]   Current Allergies (reviewed today): ! VICODIN ! PERCOCET  Laboratory Results   Urine Tests    Routine Urinalysis   Color: yellow Appearance: Clear Glucose: negative   (Normal Range: Negative) Bilirubin: negative   (Normal Range: Negative) Ketone: negative   (Normal Range: Negative) Spec. Gravity: 1.010   (Normal Range: 1.003-1.035) Blood: trace-lysed   (Normal Range: Negative) pH: 7.5   (Normal Range: 5.0-8.0) Protein: 30   (Normal Range: Negative) Urobilinogen: 0.2   (Normal Range: 0-1) Nitrite: negative   (Normal Range: Negative) Leukocyte Esterace: negative   (Normal Range: Negative)

## 2010-07-03 NOTE — Medication Information (Signed)
Summary: Approval for Cymbalta/[employer]  Approval for Cymbalta/[employer]   Imported By: Lanelle Bal 08/30/2009 10:08:46  _____________________________________________________________________  External Attachment:    Type:   Image     Comment:   External Document

## 2010-07-03 NOTE — Assessment & Plan Note (Signed)
Summary: 3 month follow up/mhf rsc from bump with patient/mhf--Rm 4   Vital Signs:  Patient profile:   25 year old female Height:      62 inches Weight:      262.75 pounds BMI:     48.23 Temp:     98.3 degrees F oral Pulse rate:   106 / minute Pulse rhythm:   regular Resp:     18 per minute BP sitting:   116 / 70  (right arm) Cuff size:   large  Vitals Entered By: Mervin Kung CMA (November 27, 2009 2:08 PM) Is Patient Diabetic? No Comments Pt now taking Loestrin 1/10mcg daily.  Has stopped Cymbalta due to sleepiness and nausea.   Primary Care Provider:  Tonie Griffith FNP   History of Present Illness: Melanie Christian is a 25 year old female who presents today for follow up of her depression.  She discontinued cymbalta due to somnolence.  Has tried Fluoxetine in the past without significant improvemnt.  Now notes that overall her depression has improved, but she continues to have + crying spells few times a week. Sleeping well.  Patient had an abortion last week- this has been a source of stress for her.  Patient was started on loestrin at the time of her abortion.  Patient notes that she has had increased anger at work- anger is disproportionate to the issues.  Got into fight with sister.  Denies current suicide ideation.    Allergies: 1)  ! Vicodin 2)  ! Percocet  Past History:  Past Surgical History: Tonsillectomy--1996 C-Section--11-09 D&C-- 8/08 Elective Abortion--11/22/09  Physical Exam  General:  Well-developed,well-nourished,in no acute distress; alert,appropriate and cooperative throughout examination Lungs:  Normal respiratory effort, chest expands symmetrically. Lungs are clear to auscultation, no crackles or wheezes. Heart:  Normal rate and regular rhythm. S1 and S2 normal without gallop, murmur, click, rub or other extra sounds.   Impression & Recommendations:  Problem # 1:  DEPRESSION (ICD-311) Assessment Improved  Patient agrees to psych referral.  She  saw therapist briefly but does not wish to continue therapy.  "It didn't help me." Did not tolerate cymbalta, did not have improvement in the past with SSRI.  Bipolar is a consideration given her anger episodes.  Will refer to psychiatry for further evaluation and treatment.  Pt instructed to go to ER if she develops suicidal thoughts.  She verbalizes understanding. 15 minutes spent with patient.  Greater than 50% of this time was spent counseling patient on her depression.   The following medications were removed from the medication list:    Cymbalta 30 Mg Cpep (Duloxetine hcl) .Marland Kitchen... 2 tablets by mouth at bedtime  Orders: Psychiatric Referral (Psych)  Complete Medication List: 1)  Excedrin Migraine 250-250-65 Mg Tabs (Aspirin-acetaminophen-caffeine) .... As needed 2)  Nexium 40 Mg Cpdr (Esomeprazole magnesium) .Marland Kitchen.. 1 by mouth once daily, 20-30 min before dinner meal 3)  Lo Loestrin Fe 1 Mg-10 Mcg / 10 Mcg Tabs (Norethin-eth estrad-fe biphas) .... Take as directed  Patient Instructions: 1)  You will be contacted about your referral to psychiatry.  2)  Please go to the ER if you develop worsening depression or thoughts of suicide.     Vital Signs:  Patient Profile:   25 year old female Height:     62 inches Weight:      262.75 pounds BMI:     48.23 Temp:     98.3 degrees F oral Pulse rate:   106 / minute  Pulse rhythm:   regular Resp:     18 per minute BP sitting:   116 / 70 Cuff size:   large                 Current Allergies (reviewed today): ! VICODIN ! PERCOCET

## 2010-07-03 NOTE — Progress Notes (Signed)
Summary: Labwork & psych appt--lm  Phone Note Call from Patient Call back at Home Phone [phone]   Caller: Patient Call For: Lemont Fillers FNP Reason for Call: Talk to Nurse, Lab or Test Results Summary of Call: Pt wants to know if she needs lab work done here again and she also is asking when her psych appt is. Pls call her Initial call taken by: Lannette Donath,  September 04, 2009 11:54 AM  Follow-up for Phone Call        Yes she needs to return pls for fasting labs (see instructions).  Also,  I do not believe that her apt has been scheduled yet but I will look into it.   Follow-up by: Lemont Fillers FNP,  September 04, 2009 12:02 PM  Additional Follow-up for Phone Call Additional follow up Details #1::        Left message on machine to return call.  Mervin Kung CMA  September 04, 2009 1:38 PM   Spoke to pt. at 2pm and she stated she just left the therapist office. She was wanting to know if she would have to have labs drawn today since she just had blood taken on 09/01/09 with GI doctor.  Advised pt. we could still add HIV and lipid panel as she states she was fasting that day.   Order faxed to [facility] lab.  Mervin Kung CMA  September 04, 2009 3:16 PM

## 2010-07-03 NOTE — Letter (Signed)
Summary: Out of School  [place] [facility]  [address]   [place], [place] [address]   Phone: [phone]  Fax: [phone]      April 23, 2010   Student:  Graylon Good Artman    To Whom It May Concern:   For Medical reasons, please excuse the above named student from school for the following dates:  Start:   April 23, 2010  End:    04/25/10. Student may return to school 04/26/10.  If you need additional information, please feel free to contact our office.   Sincerely,     Mervin Kung CMA (AAMA) Sandford Craze, NP    ****This is a legal document and cannot be tampered with.  Schools are authorized to verify all information and to do so accordingly.

## 2010-07-03 NOTE — Progress Notes (Signed)
Summary: lab result question  Phone Note Outgoing Call   Summary of Call: Please  call patient and let her know that her urine culture was negative.  GC/Chlamydia is negative.  The wet prep does look like she has bacterial vaginosis (BV).  This is not an STD but just an overgrowth of bacteria.  She should use metrogel as below.  Initial call taken by: Lemont Fillers FNP,  February 19, 2010 8:06 AM  Follow-up for Phone Call        Pt notified per [facility] instructions. Pt wants to know if she should stop the Cipro. Ok per pt to leave message on her voicemail. Nicki Guadalajara Fergerson CMA Duncan Dull)  February 19, 2010 1:03 PM   Additional Follow-up for Phone Call Additional follow up Details #1::        Left message for patient instructing her that it is OK for her to stop cipro as long as she is afebrile and back pain is improved.   Additional Follow-up by: Lemont Fillers FNP,  February 19, 2010 1:26 PM    New/Updated Medications: METROGEL-VAGINAL 0.75 % GEL (METRONIDAZOLE) one applicator full in vagina at bedtime x 5 days Prescriptions: METROGEL-VAGINAL 0.75 % GEL (METRONIDAZOLE) one applicator full in vagina at bedtime x 5 days  #1 x 0   Entered and Authorized by:   Lemont Fillers FNP   Signed by:   Lemont Fillers FNP on 02/19/2010   Method used:   Electronically to        [facility] (retail)       [address]       [place], [place]  [id number]       Ph: [phone]       Fax: [phone]   RxID:   [id number]

## 2010-07-03 NOTE — Assessment & Plan Note (Signed)
Summary: CPX  AND PAP Leafy Half   Vital Signs:  Patient profile:   25 year old female Height:      62 inches Weight:      255.01 pounds BMI:     46.81 Temp:     98.5 degrees F oral Pulse rate:   78 / minute Pulse rhythm:   regular Resp:     16 per minute BP sitting:   100 / 70  (right arm) Cuff size:   large  Vitals Entered By: Mervin Kung CMA (August 30, 2009 2:51 PM) CC: room 4  Pt is here for physical and pap smear.  LMP-- 08/16/09.  Pt. states that the Cymbalta is making her sleepy.   CC:  room 4  Pt is here for physical and pap smear.  LMP-- 08/16/09.  Pt. states that the Cymbalta is making her sleepy.Marland Kitchen  History of Present Illness: Ms Melanie Christian is a 25 year old female who presents today for follow up of her depression.  Notes that her depression has improved on this medication.  Denies suicide ideation, does note that she has felt sleepy on this medication.    Preventative-  Up to date on her tetanus, exercises daily at the gym (does zumba and sees a weight trainer)  Notes that she has been dieting.  Generally eats healthy.    Migraines- notes that she has been having migraines about 2-3x a week. These headaches are relieved by excedrin migraine.    Anemia  GERD-takes a generic [employer] antacid.  Notes that she is lactose intolerant.    Allergies: 1)  ! Vicodin 2)  ! Percocet  Review of Systems       Constitutional: Denies Fever ENT:  Denies nasal congestion or sore throat. Resp: Denies cough CV:  Denies Chest Pain GI:  Denies nausea or vomitting GU: Denies dysuria Lymphatic: Denies lymphadenopathy Musculoskeletal:  Denies muscle/joint pain Skin:  Denies Rashes Psychiatric: see HPI Neuro: Denies numbness     Physical Exam  General:  overweight AA female NAD Head:  Normocephalic and atraumatic without obvious abnormalities. No apparent alopecia or balding. Eyes:  PERRLa Ears:  External ear exam shows no significant lesions or deformities.  Otoscopic examination  reveals clear canals, tympanic membranes are intact bilaterally without bulging, retraction, inflammation or discharge. Hearing is grossly normal bilaterally. Mouth:  Oral mucosa and oropharynx without lesions or exudates.  Teeth in good repair. Neck:  No deformities, masses, or tenderness noted. Breasts:  No mass, nodules, thickening, tenderness, bulging, retraction, inflamation, nipple discharge or skin changes noted.   Lungs:  Normal respiratory effort, chest expands symmetrically. Lungs are clear to auscultation, no crackles or wheezes. Heart:  Normal rate and regular rhythm. S1 and S2 normal without gallop, murmur, click, rub or other extra sounds. Abdomen:  Bowel sounds positive,abdomen soft and non-tender without masses, organomegaly or hernias noted. Genitalia:  Normal introitus for age, no external lesions, no vaginal discharge, mucosa pink and moist, no vaginal or cervical lesions, no vaginal atrophy, no friaility or hemorrhage, normal uterus size and position, no adnexal masses or tenderness Msk:  No deformity or scoliosis noted of thoracic or lumbar spine.   Extremities:  No clubbing, cyanosis, edema, or deformity noted with normal full range of motion of all joints.   Neurologic:  No cranial nerve deficits noted. Station and gait are normal. Plantar reflexes are down-going bilaterally. DTRs are symmetrical throughout. Sensory, motor and coordinative functions appear intact. Skin:  some hyperpigmentation noted at the base  of her neck.   Psych:  Appears calm, less depressed today.  More interactive. Oriented X3.     Impression & Recommendations:  Problem # 1:  Preventive Health Care (ICD-V70.0)  Plan follow up for fasting labs.  I commended her work with exercise, and encouraged her to try to incorporate more fruits and vegetables into her diet.  Immunizations are up to date.  Pap completed today.  Pt counseled on BSE.  Orders: Pap Smear, Thin Prep ( Collection of) (Z6109)  Problem  # 2:  IRRITABLE BOWEL SYNDROME (ICD-564.1) Assessment: New  Patient notes intermittent constipation alternating with diarrhea.  Wants referral to GI.   Orders: Gastroenterology Referral (GI)  Problem # 3:  COMMON MIGRAINE (ICD-346.10) Assessment: Unchanged Notes that migraines are well controlled with as needed excedrin.   Her updated medication list for this problem includes:    Excedrin Migraine 250-250-65 Mg Tabs (Aspirin-acetaminophen-caffeine) .Marland Kitchen... As needed  Problem # 4:  DEPRESSION (ICD-311) Assessment: Improved Depressive symptoms are improved at this time.  Plan to continue cymbalta.  Will change to a once daily dosing schedule and try giving at bedtime due to c/o somnolence.   Her updated medication list for this problem includes:    Cymbalta 30 Mg Cpep (Duloxetine hcl) .Marland Kitchen... 2 tablets by mouth at bedtime  Complete Medication List: 1)  Vita-natal Caps (Prenatal multivit-min-fe-fa) .... As needed 2)  Excedrin Migraine 250-250-65 Mg Tabs (Aspirin-acetaminophen-caffeine) .... As needed 3)  Acai Berry 500 Mg Caps (Acai) .... As needed 4)  Cymbalta 30 Mg Cpep (Duloxetine hcl) .... 2 tablets by mouth at bedtime  Patient Instructions: 1)  Please return fasting for the following labs: 2)  BMET, CBC, FLP, TSH, HIV (v70) 3)  Keep up the good work with your diet and exercise. 4)  Please follow up in 3 months.      Current Allergies (reviewed today): ! VICODIN ! PERCOCET

## 2010-07-03 NOTE — Assessment & Plan Note (Signed)
Summary: SORE THROAT/DT   Vital Signs:  Patient profile:   25 year old female Height:      62 inches Weight:      275.01 pounds BMI:     50.48 Temp:     98.5 degrees F oral Pulse rate:   78 / minute Pulse rhythm:   regular Resp:     12 per minute BP sitting:   108 / 68  (right arm) Cuff size:   large  Vitals Entered By: Mervin Kung CMA (September 04, 2009 2:25 PM) CC: room  5  N/V episodes on Friday.  Sore throat x 3 days.   Primary Care Provider:  Tonie Griffith Christian  CC:  room  5  N/V episodes on Friday.  Sore throat x 3 days.Marland Kitchen  History of Present Illness: Melanie Christian is a 25 year old female who presents with complaint of nausea/vomitting on Friday.  Patient also had diarrhea on Saturday.  She developed sore throat on Saturday.  Notes that her son has had some nasal congestion.  Notes subjective fever but has not taken her temperature.  Notes that it is hard for her to swallow.  Has tried OTC allergy med/ cold med without improvement.   Allergies: 1)  ! Vicodin 2)  ! Percocet  Physical Exam  General:  Well-developed,well-nourished,in no acute distress; alert,appropriate and cooperative throughout examination Ears:  External ear exam shows no significant lesions or deformities.  Otoscopic examination reveals clear canals, tympanic membranes are intact bilaterally without bulging, retraction, inflammation or discharge. Hearing is grossly normal bilaterally. Mouth:  mild pharyngeal erythema without exudates Neck:  No deformities, masses, or tenderness noted. Lungs:  Normal respiratory effort, chest expands symmetrically. Lungs are clear to auscultation, no crackles or wheezes. Heart:  Normal rate and regular rhythm. S1 and S2 normal without gallop, murmur, click, rub or other extra sounds.   Impression & Recommendations:  Problem # 1:  STREPTOCOCCAL PHARYNGITIS (ICD-034.0) Assessment New Will treat with amoxiciliin.   Her updated medication list for this problem  includes:    Amoxicillin 500 Mg Cap (Amoxicillin) .Marland Kitchen... Take 1 capsule by mouth three times a day x 10 days  Problem # 2:  DEPRESSION (ICD-311) Assessment: Improved Notes that she has felt sleepy last few days but has also been taking allergy medications.  I instructed pt to d/c allergy meds and start abx for #1.  Call if her somnolence does not improve with these measures.  She had her first therapy visit this AM. Her updated medication list for this problem includes:    Cymbalta 30 Mg Cpep (Duloxetine hcl) .Marland Kitchen... 2 tablets by mouth at bedtime  Complete Medication List: 1)  Vita-natal Caps (Prenatal multivit-min-fe-fa) .... As needed 2)  Excedrin Migraine 250-250-65 Mg Tabs (Aspirin-acetaminophen-caffeine) .... As needed 3)  Acai Berry 500 Mg Caps (Acai) .... As needed 4)  Cymbalta 30 Mg Cpep (Duloxetine hcl) .... 2 tablets by mouth at bedtime 5)  Nexium 40 Mg Cpdr (Esomeprazole magnesium) .Marland Kitchen.. 1 by mouth once daily 6)  Amoxicillin 500 Mg Cap (Amoxicillin) .... Take 1 capsule by mouth three times a day x 10 days  Other Orders: Rapid Strep ([id number])  Patient Instructions: 1)  Complete amoxicillin. 2)  Call if your symptoms worsen or do not improve. 3)  Please return fasting for FLP and HIV screen (v70) Prescriptions: AMOXICILLIN 500 MG CAP (AMOXICILLIN) Take 1 capsule by mouth three times a day X 10 days  #30 x 0   Entered and  Authorized by:   Lemont Fillers Christian   Signed by:   Lemont Fillers Christian on 09/04/2009   Method used:   Electronically to        [facility] (retail)       [address]       [place], [place]  [id number]       Ph: [phone]       Fax: [phone]   RxID:   [id number]   Current Allergies (reviewed today): ! VICODIN ! PERCOCET

## 2010-07-03 NOTE — Progress Notes (Signed)
Summary: [facility] APPROVED CYMBALTA TILL 08-19-2019  Phone Note Other Incoming   Caller: [facility]  Summary of Call: [facility] APPROVED Irish Lack 08-19-2019  Initial call taken by: Roselle Locus,  August 22, 2009 8:43 AM     Appended Document: [facility] APPROVED CYMBALTA TILL 08-19-2019 Notified Bobby at [facility] on [place] that Cymbalta has been approved by ins. until 08/19/2019. Left message on pt's voicemail that med has been approved.

## 2010-07-03 NOTE — Assessment & Plan Note (Signed)
Summary: nurse visit for urine hcg wants to see dr/mhf--rm 5   Vital Signs:  Patient profile:   25 year old female Height:      62 inches Weight:      253.75 pounds BMI:     46.58 Temp:     98.0 degrees F oral Pulse rate:   72 / minute Pulse rhythm:   regular Resp:     18 per minute BP sitting:   130 / 72  (right arm) Cuff size:   large  Vitals Entered By: Mervin Kung CMA [facility]) (February 16, 2010 11:15 AM) CC: Rm 5  Pt here for urine dip and pregnancy test. States she has had low back pain and frequency. Is Patient Diabetic? No Comments Amitriptyline was stopped due to weight gain.  Started her on: Effexor, Adderall and Lorazepam.  All other med doses and directions are correct. Nicki Guadalajara Fergerson CMA [facility])  February 16, 2010 11:20 AM    Primary Care Provider:  Tonie Griffith FNP  CC:  Rm 5  Pt here for urine dip and pregnancy test. States she has had low back pain and frequency..  History of Present Illness: Melanie Christian is a 25 year old female who presents today with complaint of amennorhea-  thinks that her last period was ? in july.  Notes some lower abdominal discomfort.  Notes some lower back pain.  Had a gap off of birth control pills of 2 weeks since her insurance was not paying for the Lo- loestrin.  She reports + unprotected sex during that time.  C/o  + urinary frequency but no burning, + nausea- afebrile. +hx of Pyelonephritis.  Denies vaginal itching or unusual discharge. 1 parter in last 6 months.  She is concerned about the possiblity of STD.    Allergies: 1)  ! Vicodin 2)  ! Percocet  Past History:  Past Medical History: Last updated: 12/15/2009 anemia Depression  Migraines Gerd UTI morbid obesity  Past Surgical History: Last updated: 12/15/2009 Tonsillectomy--1996 C-Section--11-09 D&C-- 8/08 Elective Abortion--11/22/09   Physical Exam  General:  Well-developed,well-nourished,in no acute distress; alert,appropriate and cooperative throughout  examination Lungs:  Normal respiratory effort, chest expands symmetrically. Lungs are clear to auscultation, no crackles or wheezes. Heart:  Normal rate and regular rhythm. S1 and S2 normal without gallop, murmur, click, rub or other extra sounds. Abdomen:  Bowel sounds positive,abdomen soft and non-tender without masses, organomegaly or hernias noted. Genitalia:  Pelvic Exam:        External: normal female genitalia without lesions or masses        Vagina: normal without lesions or masses- + bloody discharge noted        Cervix: normal without lesions or masses        Adnexa: Mild left adnexal tenderness.        Uterus: normal by palpation        Pap smear: not performed Msk:  Mild right sided CVAT   Impression & Recommendations:  Problem # 1:  UTI (ICD-599.0) Assessment New Pt with R sided CVAT and right sided pelvic pain.  Will plan to treat patient with Cipro.  Pt instructed to call if fever over 101, worsening abdomin/back pain, or if symptoms do not improve.  + menses today- attempted wet prep, but will likely be limited due to + blood.  Will also send for Gc/Chlamydia as pt is concerned that she may have been exposed to STD.  Urine HCG is negative.  + menses today. Her  updated medication list for this problem includes:    Cipro 500 Mg Tabs (Ciprofloxacin hcl) ..... One tablet by mouth two times a day x 7 days  Orders: Specimen Handling ([address]) T-Culture, Urine ([id number]) UA Dipstick w/o Micro (manual) (81002)  Complete Medication List: 1)  Excedrin Migraine 250-250-65 Mg Tabs (Aspirin-acetaminophen-caffeine) .... As needed 2)  Nexium 40 Mg Cpdr (Esomeprazole magnesium) .Marland Kitchen.. 1 by mouth once daily, 20-30 min before dinner meal 3)  Microgestin Fe 1/20 1-20 Mg-mcg Tabs (Norethin ace-eth estrad-fe) .... Take as directed 4)  Frova 2.5 Mg Tabs (Frovatriptan succinate) .... One by mouth once daily as needed for severe migraine 5)  Amitriptyline Hcl 10 Mg Tabs (Amitriptyline hcl)  .... One by mouth qpm 6)  Cipro 500 Mg Tabs (Ciprofloxacin hcl) .... One tablet by mouth two times a day x 7 days  Other Orders: T-Wet Prep by Molecular Probe [phone]) T-Chlamydia & GC Probe, Genital ([id number])  Patient Instructions: 1)  Use condoms while on antibiotics and for at least 1 week after completing the antibiotics.   2)  Call us if symptoms worsen or do not improve.  Prescriptions: CIPRO 500 MG TABS (CIPROFLOXACIN HCL) one tablet by mouth two times a day x 7 days  #14 x 0   Entered and Authorized by:   Lemont Fillers FNP   Signed by:   Lemont Fillers FNP on 02/16/2010   Method used:   Electronically to        [facility] (retail)       [address]       [place], [place]  [id number]       Ph: [phone]       Fax: [phone]   RxID:   [id number]   Current Allergies (reviewed today): ! VICODIN ! PERCOCET  Laboratory Results   Urine Tests  Date/Time Received: 02-16-10 Date/Time Reported: Mervin Kung CMA [facility])  February 16, 2010 11:33 AM   Routine Urinalysis   Color: amber Appearance: Cloudy Glucose: negative   (Normal Range: Negative) Bilirubin: negative   (Normal Range: Negative) Ketone: negative   (Normal Range: Negative) Spec. Gravity: >=1.030   (Normal Range: 1.003-1.035) Blood: large   (Normal Range: Negative) pH: 6.0   (Normal Range: 5.0-8.0) Protein: 30   (Normal Range: Negative) Urobilinogen: 0.2   (Normal Range: 0-1) Nitrite: negative   (Normal Range: Negative) Leukocyte Esterace: trace   (Normal Range: Negative)    Urine HCG: negative Comments: Urine has been cultured. Nicki Guadalajara Fergerson CMA [facility])  February 16, 2010 11:33 AM

## 2010-08-03 ENCOUNTER — Ambulatory Visit (INDEPENDENT_AMBULATORY_CARE_PROVIDER_SITE_OTHER): Payer: 59 | Admitting: Family

## 2010-08-03 ENCOUNTER — Encounter: Payer: Self-pay | Admitting: Family

## 2010-08-03 DIAGNOSIS — N921 Excessive and frequent menstruation with irregular cycle: Secondary | ICD-10-CM | POA: Insufficient documentation

## 2010-08-03 DIAGNOSIS — G43009 Migraine without aura, not intractable, without status migrainosus: Secondary | ICD-10-CM

## 2010-08-06 ENCOUNTER — Encounter (INDEPENDENT_AMBULATORY_CARE_PROVIDER_SITE_OTHER): Payer: Self-pay | Admitting: *Deleted

## 2010-08-08 ENCOUNTER — Telehealth: Payer: Self-pay | Admitting: Family

## 2010-08-08 ENCOUNTER — Encounter: Payer: Self-pay | Admitting: Family

## 2010-08-08 LAB — CONVERTED CEMR LAB: GC Probe Amp, Genital: NEGATIVE

## 2010-08-09 NOTE — Assessment & Plan Note (Signed)
Summary: pelvic pains and spotting/off and on/ss--rm 6   Vital Signs:  Patient profile:   25 year old female Height:      62 inches Weight:      259 pounds BMI:     47.54 Temp:     98.1 degrees F oral Pulse rate:   72 / minute Pulse rhythm:   regular Resp:     16 per minute BP sitting:   118 / 80  (right arm) Cuff size:   large  Vitals Entered By: Mervin Kung CMA (AAMA) (August 03, 2010 10:20 AM) CC: Intermittent pelvic cramping and spotting x 1 month. Is Patient Diabetic? No Pain Assessment Patient in pain? no      Comments Pt could not take Frova due to cost.  Has completed Amoxicillin. No longer takes Vyvanse or Effexor. Mervin Kung CMA [facility])  August 03, 2010 10:27 AM    Primary Care Provider:  Tonie Griffith FNP  CC:  Intermittent pelvic cramping and spotting x 1 month..  History of Present Illness: 25 year old female presents today with chief complaint of pelvic pain, and vaginal spotting.    Notes that pain radiates down into the right groin.   Spotting starts on the second week of her pill pack. Starts as spotting,then becomes heavier. Last month notes that she bled  for a total of 3 weeks. Denies vaginal discharge.  No new sexual partners.  Denies fever.    Migraines 2-3 times a week- using Frova.  No improvement with the Frova.    Sore throat today- denies associated nasal congestion.    Allergies: 1)  ! Vicodin 2)  ! Percocet  Past History:  Past Medical History: Last updated: 12/15/2009 anemia Depression  Migraines Gerd UTI morbid obesity  Past Surgical History: Last updated: 12/15/2009 Tonsillectomy--1996 C-Section--11-09 D&C-- 8/08 Elective Abortion--11/22/09   Review of Systems       see HPI, chronic pain with sexual intercourse  Physical Exam  General:  Well-developed,well-nourished,in no acute distress; alert,appropriate and cooperative throughout examination Lungs:  Normal respiratory effort, chest expands symmetrically.  Lungs are clear to auscultation, no crackles or wheezes. Heart:  Normal rate and regular rhythm. S1 and S2 normal without gallop, murmur, click, rub or other extra sounds. Abdomen:  Soft, NT/ND Genitalia:  Pelvic Exam:        External: normal female genitalia without lesions or masses        Vagina: normal without lesions or masses, mild vaginal bleeding noted on exam        Cervix: normal without lesions or masses        Adnexa: normal bimanual exam without masses or fullness        Uterus: normal by palpation        Pap smear: not performed   Impression & Recommendations:  Problem # 1:  MENSTRUAL SPOTTING (ICD-626.6) Assessment New 25 year old patient with history of pelvic pain, and intermittent bleeding on microgestin.  HCG is negative. Orders: Urine Pregnancy Test  (04540) Gynecologic Referral (Gyn)  Problem # 2:  COMMON MIGRAINE (ICD-346.10) Assessment: Deteriorated  No improvement with frova.  Will refer to HA specialist. Her updated medication list for this problem includes:    Excedrin Migraine 250-250-65 Mg Tabs (Aspirin-acetaminophen-caffeine) .Marland Kitchen... As needed    Frova 2.5 Mg Tabs (Frovatriptan succinate) ..... One by mouth once daily as needed for severe migraine  Orders: Headache Clinic Referral (Headache)  Problem # 3:  SORE THROAT (ICD-462) Assessment: Comment  Only 24 hr hx of mild sore throat, suspect viral etiology.  Pt to call if symptoms worsen or do not improve.   Complete Medication List: 1)  Excedrin Migraine 250-250-65 Mg Tabs (Aspirin-acetaminophen-caffeine) .... As needed 2)  Nexium 40 Mg Cpdr (Esomeprazole magnesium) .Marland Kitchen.. 1 by mouth once daily, 20-30 min before dinner meal 3)  Microgestin Fe 1/20 1-20 Mg-mcg Tabs (Norethin ace-eth estrad-fe) .... Take as directed 4)  Frova 2.5 Mg Tabs (Frovatriptan succinate) .... One by mouth once daily as needed for severe migraine 5)  Vyvanse 60 Mg Caps (Lisdexamfetamine dimesylate) .... Take 1 capsule by mouth  once a day. 6)  Amoxicillin 500 Mg Caps (Amoxicillin) .... 2 caps by mouth two times a day for 10 days 7)  Effexor Ir 75mg   .... Take 1 tablet by mouth once a day.  Other Orders: T-Chlamydia & GC Probe, Genital ([id number])  Patient Instructions: 1)  You will be contacted about your referral to the Headache clinic and to GYN. 2)  Follow up in 3 months, sooner if symptoms worsen or do not improve.    Orders Added: 1)  T-Chlamydia & GC Probe, Genital [[id number]] 2)  Urine Pregnancy Test  [81025] 3)  Gynecologic Referral [Gyn] 4)  Headache Clinic Referral [Headache] 5)  Est. Patient Level III [81191]    Current Allergies (reviewed today): ! VICODIN ! PERCOCET  Laboratory Results   Urine Tests   Date/Time Reported: Mervin Kung CMA [facility])  August 03, 2010 12:14 PM     Urine HCG: negative

## 2010-08-10 ENCOUNTER — Encounter: Payer: Self-pay | Admitting: Family

## 2010-08-14 NOTE — Progress Notes (Signed)
----   Converted from flag ---- ---- 08/08/2010 11:31 AM, Mervin Kung CMA (AAMA) wrote: Per Rosalio Loud at [facility], test was enterd wrong by accessioning. She will correct and send the result to [place].  ---- 08/08/2010 11:16 AM, Lemont Fillers FNP wrote: could you pls ask lab to run Gonorrhea as well, for some reason they only tested chlamydia. Thanks ------------------------------

## 2010-08-14 NOTE — Letter (Signed)
Summary: Primary Care Consult Scheduled Letter  [place] at [facility]  [address]   [place], [place] [address]   Phone: [phone]  Fax: [phone]      08/06/2010 MRN: [id number]  Melanie Christian [address] [place], [place]  [address]  [place]    Dear [name],      We have scheduled an appointment for you.  At the recommendation of MELISSA O'SULLIVAN FNP, we have scheduled you a consult with [facility]  on APRIL 23,2012 at 10:15AM.  Their address is_[address], [place] . The office phone number is [phone].  If this appointment day and time is not convenient for you, please feel free to call the office of the doctor you are being referred to at the number listed above and reschedule the appointment.     It is important for you to keep your scheduled appointments. We are here to make sure you are given good patient care.     Thank you, Darral Dash Patient Care Coordinator [place] at [facility]

## 2010-08-14 NOTE — Letter (Signed)
   [place] at [facility] [address] [place], [place]  [address]  [place] Phone: [phone]      August 10, 2010   [place] [address] [place], [place] [address]  RE:  LAB RESULTS  Dear  Ms. Huttner,  The following is an interpretation of your most recent lab tests.  Please take note of any instructions provided or changes to medications that have resulted from your lab work.   Your screening for gonorrhea and chlamydia testing is negative.      I hope you are feeling better.     Sincerely Yours,    Lemont Fillers FNP

## 2010-08-27 ENCOUNTER — Encounter: Payer: Self-pay | Admitting: Family

## 2010-08-28 ENCOUNTER — Ambulatory Visit (INDEPENDENT_AMBULATORY_CARE_PROVIDER_SITE_OTHER): Payer: 59 | Admitting: Family

## 2010-08-28 ENCOUNTER — Encounter: Payer: Self-pay | Admitting: Family

## 2010-08-28 VITALS — BP 110/70 | HR 90 | Temp 98.2°F | Resp 16 | Ht 62.0 in | Wt 258.1 lb

## 2010-08-28 DIAGNOSIS — A5901 Trichomonal vulvovaginitis: Secondary | ICD-10-CM

## 2010-08-28 DIAGNOSIS — R350 Frequency of micturition: Secondary | ICD-10-CM

## 2010-08-28 DIAGNOSIS — R319 Hematuria, unspecified: Secondary | ICD-10-CM

## 2010-08-28 DIAGNOSIS — A599 Trichomoniasis, unspecified: Secondary | ICD-10-CM | POA: Insufficient documentation

## 2010-08-28 LAB — POCT URINALYSIS DIPSTICK
Glucose, UA: NEGATIVE
Nitrite, UA: NEGATIVE
Spec Grav, UA: 1.03
Urobilinogen, UA: NEGATIVE
pH, UA: NEGATIVE

## 2010-08-28 MED ORDER — METRONIDAZOLE 0.75 % VA GEL: 1.0000 | Freq: Every day | VAGINAL | Status: DC

## 2010-08-28 MED ORDER — METRONIDAZOLE 500 MG PO TABS: 500.0000 mg | ORAL_TABLET | Freq: Two times a day (BID) | ORAL | Status: AC

## 2010-08-28 NOTE — Assessment & Plan Note (Addendum)
Symptoms consistent with re-infection with Trichomonas.  Will treat with metronidazole tabs.  Metrogel was ordered in error and cancelled with the pharmacy.

## 2010-08-28 NOTE — Patient Instructions (Signed)
Call if your symptoms worsen or do not improve. Please follow up in 1 month for a complete physical.

## 2010-08-28 NOTE — Progress Notes (Signed)
  Subjective:    Patient ID: Melanie Christian, female    DOB: [birth year], 25 y.o.   MRN: [id number]  HPI  Pt diagnosed with trich on 08/09/10- completed 5 days of flagyl. Her female partner was diagnosed on Saturday. She is now being treated.  She is concerned that she is reinfected.  Vaginal itching and discharge.    Review of Systems See HPI    Objective:   Physical Exam  Constitutional: She appears well-developed and well-nourished.  Cardiovascular: Normal rate and regular rhythm.   Pulmonary/Chest: Effort normal and breath sounds normal.  Psychiatric: She has a normal mood and affect. Her behavior is normal.          Assessment & Plan:

## 2010-08-30 ENCOUNTER — Encounter: Payer: Self-pay | Admitting: Family

## 2010-08-30 LAB — URINE CULTURE: Organism ID, Bacteria: NO GROWTH

## 2010-09-25 ENCOUNTER — Ambulatory Visit (INDEPENDENT_AMBULATORY_CARE_PROVIDER_SITE_OTHER): Payer: 59 | Admitting: Family

## 2010-09-25 ENCOUNTER — Other Ambulatory Visit (HOSPITAL_COMMUNITY)
Admission: RE | Admit: 2010-09-25 | Discharge: 2010-09-25 | Disposition: A | Discharge status: Home / Self Care | Payer: 59 | Source: Ambulatory Visit | Attending: Family | Admitting: Family

## 2010-09-25 ENCOUNTER — Telehealth: Payer: Self-pay | Admitting: *Deleted

## 2010-09-25 ENCOUNTER — Encounter: Payer: Self-pay | Admitting: Family

## 2010-09-25 ENCOUNTER — Other Ambulatory Visit: Payer: Self-pay | Admitting: Family

## 2010-09-25 VITALS — BP 126/74 | HR 66 | Temp 98.1°F | Resp 16 | Ht 62.0 in | Wt 263.1 lb

## 2010-09-25 DIAGNOSIS — A599 Trichomoniasis, unspecified: Secondary | ICD-10-CM

## 2010-09-25 DIAGNOSIS — Z0000 Encounter for general adult medical examination without abnormal findings: Secondary | ICD-10-CM

## 2010-09-25 DIAGNOSIS — F329 Major depressive disorder, single episode, unspecified: Secondary | ICD-10-CM

## 2010-09-25 DIAGNOSIS — Z113 Encounter for screening for infections with a predominantly sexual mode of transmission: Secondary | ICD-10-CM | POA: Insufficient documentation

## 2010-09-25 DIAGNOSIS — A5901 Trichomonal vulvovaginitis: Secondary | ICD-10-CM

## 2010-09-25 DIAGNOSIS — N898 Other specified noninflammatory disorders of vagina: Secondary | ICD-10-CM

## 2010-09-25 DIAGNOSIS — Z01419 Encounter for gynecological examination (general) (routine) without abnormal findings: Secondary | ICD-10-CM

## 2010-09-25 MED ORDER — VENLAFAXINE HCL ER 75 MG PO CP24: 75.0000 mg | ORAL_CAPSULE | Freq: Every day | ORAL | Status: DC

## 2010-09-25 NOTE — Assessment & Plan Note (Signed)
Pt counseled on diet, exercise and weight loss.  Immunizations reviewed and up to date. Pt counseled to cut back on ETOH.  Pap performed today.

## 2010-09-25 NOTE — Telephone Encounter (Signed)
Attempted to reach pt at her cell # but call was answered by a Wynona Canes informing me I had the wrong number. Left message on home number for pt to return my call.   Per Efraim Kaufmann,  please have patient complete ua with culture as part of her lab work downstairs. Thanks

## 2010-09-25 NOTE — Assessment & Plan Note (Signed)
Still with foul smelling vaginal discharge.  Wet prep repeated today.

## 2010-09-25 NOTE — Patient Instructions (Signed)
Please go to the lab fasting one morning this week to complete your blood work. You will be contacted about your referral to psychiatry. Follow up in 1 month. Go to the ER if you develop suicidal thoughts. Try to eliminate alcohol.

## 2010-09-25 NOTE — Progress Notes (Signed)
Subjective:    Patient ID: Melanie Christian, female    DOB: [birth year], 25 y.o.   MRN: [id number]  HPI  Depression- She was following with Dr. Otelia Santee- she reports that she stopped seeing him and discontinued meds in January.  She is agreeable to try another psychiatrist.  Notes that she continues to have mood swings- tearful, poor energy, "always hungry." Occasional "ok" days.  She notes that she easily becomes angered. She has been drinking more lately-drinks tequila/wine coolers/wine- 4 drinks a day.  She most recently was taking prozac.    Preventative- last pap March 2011- normal. She had breakfast today.  She has gained 13 pounds since her physical last year.  Not exercising.  Not eating very healthy.      Review of Systems  Constitutional: Positive for fatigue and unexpected weight change. Negative for fever.  HENT: Positive for rhinorrhea.   Eyes: Negative for itching and visual disturbance.  Respiratory: Negative for chest tightness and shortness of breath.   Cardiovascular: Negative for chest pain and leg swelling.  Gastrointestinal: Negative for vomiting, abdominal pain, diarrhea and constipation.  Genitourinary: Positive for frequency. Negative for decreased urine volume and dyspareunia.  Musculoskeletal: Negative for arthralgias and gait problem.  Skin: Negative.   Neurological: Negative for weakness and numbness.  Hematological: Negative for adenopathy.  Psychiatric/Behavioral:       See HPI   Past Medical History  Diagnosis Date  . Anemia   . Depression   . GERD (gastroesophageal reflux disease)   . UTI (lower urinary tract infection)   . Obesities, morbid   . Migraines     History   Social History  . Marital Status: Single    Spouse Name: N/A    Number of Children: N/A  . Years of Education: N/A   Occupational History  . Not on file.   Social History Main Topics  . Smoking status: Never Smoker   . Smokeless tobacco: Never Used  . Alcohol Use: 1.0 - 1.5  oz/week    2-3 drink(s) per week     mixed drinks and vodka when depressed  . Drug Use: Yes     marijuana use  . Sexually Active: Not on file   Other Topics Concern  . 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    Past Surgical History  Procedure Date  . Tonsillectomy 1996  . Cesarean section 04/2008  . Dilation and curettage of uterus 01/2007  . Induced abortion 11/22/09    elective    Family History  Problem Relation Age of Onset  . Arthritis Mother   . Hyperlipidemia Mother   . Hypertension Mother   . Drug abuse Father   . Hypertension Father   . Depression Father     PTSD from [place]  . Polycystic ovary syndrome Sister   . Arthritis Maternal Grandmother   . Diabetes Maternal Grandmother   . Cancer Maternal Grandmother     stomach  . Hypertension Paternal Grandmother   . Sickle cell trait Other 30    Allergies  Allergen Reactions  . Oxycodone-Acetaminophen     REACTION: itching    Current Outpatient Prescriptions on File Prior to Visit  Medication Sig Dispense Refill  . aspirin-acetaminophen-caffeine (EXCEDRIN MIGRAINE) 250-250-65 MG per tablet Take 1 tablet by mouth as needed.        Marland Kitchen esomeprazole (NEXIUM) 40 MG capsule Take 1 capsule daily 20-30 minutes before  dinner meal.       . frovatriptan (FROVA) 2.5 MG tablet Take 2.5 mg by mouth as needed. If recurs, may repeat after 2 hours. Max of 3 tabs in 24 hours. For severe migraine.       Marland Kitchen HYDROcodone-acetaminophen (VICODIN ES) 7.5-750 MG per tablet       . norethindrone-ethinyl estradiol (MICROGESTIN FE 1/20) 1-20 MG-MCG per tablet Take 1 tablet by mouth daily. As directed.         BP 126/74  Pulse 66  Temp(Src) 98.1 F (36.7 C) (Oral)  Resp 16  Ht 5\' 2"  (1.575 m)  Wt 263 lb 1.3 oz (119.332 kg)  BMI 48.12 kg/m2  LMP 09/11/2010        Objective:   Physical Exam  Constitutional: She is oriented to person, place, and time. She appears  well-developed and well-nourished.  HENT:  Head: Normocephalic and atraumatic.  Eyes: Conjunctivae are normal. Pupils are equal, round, and reactive to light.  Neck: Normal range of motion. Neck supple. No tracheal deviation present. No thyromegaly present.  Cardiovascular: Normal rate and regular rhythm.   Pulmonary/Chest: Effort normal and breath sounds normal.  Abdominal: Soft. Bowel sounds are normal.  Genitourinary: Uterus normal. No breast swelling, tenderness, discharge or bleeding. There is no rash or lesion on the right labia. There is no rash or lesion on the left labia. Vaginal discharge found.       No breast masses, no adnexal fullness.  Musculoskeletal: Normal range of motion. She exhibits no edema and no tenderness.  Lymphadenopathy:    She has no cervical adenopathy.  Neurological: She is alert and oriented to person, place, and time.  Skin: Skin is warm.       Palms moist bilaterally  Psychiatric:       Flat affect but pleasant.            Assessment & Plan:

## 2010-09-25 NOTE — Assessment & Plan Note (Signed)
Deteriorated.  Will resume Effexor which patient responded well to in the past and will refer to another psychiatrist.  Pt declines referral to therapist.  Pt instructed to go to the ED if she develops suicide ideation.  20 minutes spent counseling pt on her depression.  I believe that she is currently self medicating with ETOH.

## 2010-09-26 LAB — WET PREP BY MOLECULAR PROBE: Gardnerella vaginalis: NEGATIVE

## 2010-09-27 NOTE — Telephone Encounter (Signed)
Pt notified of need to return to lab to complete bloodwork and urine testing as part of her physical. Pt states she will go to the lab tomorrow. Orders entered and faxed to the lab.

## 2010-09-29 LAB — CBC WITH DIFFERENTIAL/PLATELET
HCT: 41.2 % (ref 36.0–46.0)
Hemoglobin: 13.1 g/dL (ref 12.0–15.0)
Lymphocytes Relative: 41 % (ref 12–46)
Lymphs Abs: 2.3 10*3/uL (ref 0.7–4.0)
Monocytes Absolute: 0.5 10*3/uL (ref 0.1–1.0)
Monocytes Relative: 8 % (ref 3–12)
Neutro Abs: 2.6 10*3/uL (ref 1.7–7.7)
RBC: 5.08 MIL/uL (ref 3.87–5.11)
WBC: 5.5 10*3/uL (ref 4.0–10.5)

## 2010-09-29 LAB — URINALYSIS, ROUTINE W REFLEX MICROSCOPIC
Glucose, UA: NEGATIVE mg/dL
Hgb urine dipstick: NEGATIVE
Leukocytes, UA: NEGATIVE
pH: 6 (ref 5.0–8.0)

## 2010-09-29 LAB — LIPID PANEL
Cholesterol: 177 mg/dL (ref 0–200)
HDL: 57 mg/dL (ref >39)
Total CHOL/HDL Ratio: 3.1 Ratio
Triglycerides: 60 mg/dL (ref <150)
VLDL: 12 mg/dL (ref 0–40)

## 2010-09-29 LAB — BASIC METABOLIC PANEL WITH GFR
BUN: 10 mg/dL (ref 6–23)
Calcium: 9.4 mg/dL (ref 8.4–10.5)
Chloride: 104 mEq/L (ref 96–112)
Creat: 0.53 mg/dL (ref 0.40–1.20)
GFR, Est Non African American: >60 mL/min (ref >60)

## 2010-10-01 ENCOUNTER — Telehealth: Payer: Self-pay | Admitting: Family

## 2010-10-01 ENCOUNTER — Encounter: Payer: Self-pay | Admitting: Family

## 2010-10-01 DIAGNOSIS — N390 Urinary tract infection, site not specified: Secondary | ICD-10-CM

## 2010-10-01 MED ORDER — CIPROFLOXACIN HCL 500 MG PO TABS: 500.0000 mg | ORAL_TABLET | Freq: Two times a day (BID) | ORAL | Status: AC

## 2010-10-01 NOTE — Telephone Encounter (Signed)
Please call pt and let her know that her urine culture is + for bacteria.  I have sent rx to her pharmacy for cipro to treat it.

## 2010-10-02 NOTE — Telephone Encounter (Signed)
Pt returned phone call.  

## 2010-10-02 NOTE — Telephone Encounter (Signed)
Left message on machine for pt to return my call  

## 2010-10-16 NOTE — Op Note (Signed)
[name], [name] NO.:  [id number]   MEDICAL RECORD NO.:  [id number]          PATIENT TYPE:  MAT   LOCATION:  [facility]                          FACILITY:  [facility]   PHYSICIAN:  Sherron Monday, MD        DATE OF BIRTH:  1985-11-26   DATE OF PROCEDURE:  01/10/2007  DATE OF DISCHARGE:                               OPERATIVE REPORT   PREOPERATIVE DIAGNOSIS:  Blighted ovum.   POSTOPERATIVE DIAGNOSIS:  Blighted ovum.   PROCEDURE:  Suction D&C.   SURGEON:  Sherron Monday, M.D.   ANESTHESIA:  IV sedation/MAC, local 20 mL of 2% lidocaine.   ESTIMATED BLOOD LOSS:  Minimal.   IV FLUIDS:  800 mL.   URINE OUTPUT:  I&O catheterization at start of the procedure.   FINDINGS:  Uterus sounds to 8 cm.   PATHOLOGY:  Products of conception.   COMPLICATIONS:  None.   DISPOSITION:  Stable to PACU.   PROCEDURE:  After informed consent was reviewed with the patient  including risks, benefits and alternatives of the surgical procedure as  well as her various other options including expectant management or  Cytotec management, and risks including but not limited to bleeding,  infection, damage to the uterus.  She was transported to the OR, placed  on the table in supine position.  The MAC anesthesia was induced and  found to be adequate.  She was then placed in the yellow fin stirrups.  Her perineum was prepped and draped in the normal sterile fashion.  Her  bladder was sterilely drained. Using an open sided speculum her cervix  was easily visualized, grasped with a single-toothed tenaculum and a 20  mL of 2% lidocaine were used to anesthetize the cervix with a cervicl  block.  The uterus then sounded to approximately 8.5 cm. It was dilated  to accommodate a 6-French flexible suction curette.  Several passes were  made and tissue was found to be removed. The uterus was then explored  gently with a curette found to be gritty in all four quadrants.  One  more pass of the suction  curette was made.  Bleeding was found to be  within  normal limits.  The tenaculum was removed and the cervix was easily  visualized to be hemostatic.  The speculum was removed.  The patient's  sponge, lap and needle counts correct x2 per the operating room nurses.  The patient was awakened in stable condition and transferred to the  PACU.      Sherron Monday, MD  Electronically Signed     JB/MEDQ  D:  01/10/2007  T:  01/11/2007  Job:  [id number]

## 2010-10-16 NOTE — Op Note (Signed)
Melanie Christian, Melanie Christian NO.:  [id number]   MEDICAL RECORD NO.:  [id number]          PATIENT TYPE:  INP   LOCATION:  9102                          [facility]ACILITY:  [facility]   PHYSICIAN:  Randye Lobo, M.D.   DATE OF BIRTH:  1986/01/22   DATE OF PROCEDURE:  04/20/2008  DATE OF DISCHARGE:                               OPERATIVE REPORT   PREOPERATIVE DIAGNOSES:  1. Intrauterine gestation at 37 weeks.  2. Failure to progress.  3. Chorioamnionitis.   POSTOPERATIVE DIAGNOSES:  1. Intrauterine gestation at 37 weeks.  2. Failure to progress.  3. Chorioamnionitis.   PROCEDURE:  Primary low segment transverse cesarean section.   SURGEON:  Randye Lobo, MD   ASSISTANT:  Gretchen Short, PA-C   ANESTHESIA:  Epidural, IV sedation, 3% Nasacaine intraperitoneally.   INTRAVENOUS FLUIDS:  3200 mL Ringer's lactate.   ESTIMATED BLOOD LOSS:  800 mL.   URINE OUTPUT:  2250 mL.   COMPLICATIONS:  None.   DRAINS:  JP drain placed in subcutaneous layer.   INDICATIONS FOR THE PROCEDURE:  The patient is a 25 year old gravida 2,  para 0-0-1-0 African American female at 84 weeks' gestation (EDC will be  May 04, 2008), who presented on April 19, 2008, with ruptured  membranes and clear fluid at 1630 on that same day.  The patient was  having contractions at that time.  The patient's cervical exam was  closed.  The patient had a negative group B strep status.  The patient  had passed an unremarkable antenatal course with the exception of a low-  grade squamous intraepithelial lesion Pap smear.  The patient was  contracting irregularly and therefore Pitocin was begun.  The patient in  the morning of April 20, 2008, was 4 cm dilated with 90% effacement  and the vertex at the -2 station.  A decision was made to proceed with  placement of an IUPC for more accurate monitoring of the contraction,  frequency, and strength.  The patient did receive an epidural for  anesthesia.  The  patient was noted to have a temperature of 102 degrees  Fahrenheit late in the afternoon on April 20, 2008.  She had been  prophylaxed with ampicillin IV prior to this.  Cervical exam at this  time and documented arrest of labor at 5 cm of dilatation.  The fetal  heart rate tracing had increased to the 150s with decreased beat-to-beat  variability.  The IUPC documented a high baseline and MVUs which ranged  from 150-240.  The patient was given a diagnosis of failure to progress  with prolonged rupture of membranes and chorioamnionitis and the  decision was made to proceed with a primary low segment transverse  cesarean section after risks, benefits, and alternatives were reviewed.   FINDINGS:  A viable female infant was delivered at 47 with Apgars of 9  at 1 minute and 9 at 5 minutes.  The position was right occiput,  transverse, and asynclitic.  The vertex was noted to be wedged deeply  into the pelvis.  The weight was 7 pounds and 4  ounces.  The amniotic  fluid was clear.  The placenta had a normal insertion of a 3-vessel  cord.  The uterus itself was unremarkable with the exception of band-  like adhesions from each of the ovaries to the posterior uterine fundus  on each ipsilateral side.  The fallopian tubes were unremarkable.   SPECIMEN:  The placenta was sent to pathology.   PROCEDURE:  The patient was taken from her labor and delivery suite down  to the operating room.  The patient had her epidural dosed for surgical  anesthesia.  The patient received Ancef 2 g IV for antibiotic  prophylaxis.  The abdomen was sterilely prepped and draped.  The patient  previously had a Foley catheter placed inside her bladder.   An Allis test was performed and the patient was noted to have adequate  surgical anesthesia.  A Pfannenstiel incision was created sharply with a  scalpel.  The monopolar cautery was used to achieve hemostasis in the  subcutaneous layer.  The fascia was incised in the  midline with a  scalpel and the incision was extended bilaterally with the Mayo  scissors.  The rectus muscles were dissected off of the fascia  superiorly and inferiorly.  The rectus muscles were sharply divided in  the midline.   The parietal peritoneum was elevated with 2 hemostat clamps and entered  sharply with the Metzenbaum scissors.  The peritoneal incision was  extended cranially and caudally.   The lower uterine segment was exposed with a bladder retractor and the  bladder flap was then sharply created.  A transverse lower uterine  segment incision was then created with a scalpel and the incision was  extended bluntly.  Membranes were ruptured with an Allis clamp.  A hand  was inserted through the uterine incision and the vertex was gently  lifted out of the pelvis and through the uterine incision.  A shoulder  cord was reduced and then the remainder of the newborn was delivered.  The nares and mouth were suctioned.  The cord was doubly clamped and  cut.  The newborn was carried over to the awaiting pediatricians in good  condition.   The placenta was manually extracted at this time and sent for cord blood  donation and cord blood extraction.  The placenta was then sent to  pathology.   The uterus was exteriorized at this time.  It was wiped clean with a lap  pad.  The uterine incision was then closed with a double layer closure  of #1 chromic.  The first was a running locked layer and the second was  an imbricating layer.  There was some bleeding noted along the right  midportion of the incision and this responded to a figure-of-eight  suture of #1 chromic.  There was some bleeding along the lower uterine  segment and this did respond to monopolar cautery.  The undersurface of  the bladder flap was oozing slightly and it was made hemostatic with  monopolar cautery.  The distal peritoneum of the bladder flap was then  closed with a running suture of 3-0 Vicryl.   The  uterus was returned to the peritoneal cavity as it was hemostatic.  The peritoneum was suctioned of any remaining clots.  At this time, the  patient was having significant discomfort and 3% Nasacaine was therefore  poured into the peritoneal cavity.   The abdominal wall was closed.  The parietal peritoneum was closed with  a running suture of  3-0 Vicryl.  The rectus muscles were reapproximated  with interrupted sutures of #1 chromic.  The fascia was closed with a  running suture of 0 Vicryl.  The subcutaneous layer was irrigated and  suctioned.  It was made hemostatic with monopolar cautery; however,  there was still some oozing appreciated and the decision was therefore  made to place a Jackson-Pratt drain in the subcutaneous layer which came  out through an exit site lateral to the left side of the Pfannenstiel  incision.  The superior portion of the subcutaneous layer was closed  with interrupted sutures of 2-0 plain gut suture.  The skin was closed  with staples.  The drain was secured to the skin with silk suture and a  pressure dressing was placed over the incision.   This concluded the patient's procedure.  There were no complications.  All needle, instrument, and sponge counts were correct.  The patient was  escorted to the recovery room in stable and awake condition.      Randye Lobo, M.D.  Electronically Signed     BES/MEDQ  D:  04/20/2008  T:  04/21/2008  Job:  [id number]

## 2010-10-19 NOTE — Discharge Summary (Signed)
[name], [name] NO.:  [id number]   MEDICAL RECORD NO.:  [id number]          PATIENT TYPE:  INP   LOCATION:  9102                          [facility]ACILITY:  [facility]   PHYSICIAN:  Ilda Mori, M.D.   DATE OF BIRTH:  1986-05-28   DATE OF ADMISSION:  04/19/2008  DATE OF DISCHARGE:  04/23/2008                               DISCHARGE SUMMARY   FINAL DIAGNOSES:  1. Intrauterine pregnancy at 37 weeks' gestation.  2. Spontaneous rupture of membranes.  3. Failure to progress.  4. Chorioamnionitis.   PROCEDURE:  Primary low segment transverse cesarean section.   SURGEON:  Randye Lobo, MD.   ASSISTANT:  Chinita Greenland, PA-C.   COMPLICATIONS:  None.   HISTORY:  This 25 year old G2, P0-0-1-0 presents at 31 weeks' gestation  with spontaneous rupture of membranes.  The patient was having  contraction this time, but her cervix was still closed.  The patient's  antepartum course up to this point had been uncomplicated.  She did have  a negative group B strep culture in our office.  It was being followed  for some low-grade dysplasia on her Pap smear, otherwise the patient's  antepartum course was uncomplicated.  Upon admission to the hospital the  patient after spontaneous rupture of membranes, the patient was  contracting irregular rate.  Pitocin was begun.  The patient dilated  about 4 cm and 90% effaced with a -2 station.  IUPCs were placed and the  patient did receive an epidural.  The patient started developing a  temperature on the afternoon of April 20, 2008, up to about 102  degrees.  She did have some ampicillin IV prior to this.  At this point,  the patient's cervix was only about 5 cm dilated and the fetal heart  rate tracing had increased to the 150s.  The patient was given a  diagnosis of failure to progress with chorioamnionitis and decision was  made to proceed with a cesarean section.  The patient was taken to the  operating room on April 20, 2008, by  Dr. Conley Simmonds where a primary  low-segment transverse cesarean section was performed with the delivery  of a 7-pound 4-ounce female infant with Apgars of 9 and 9.  Upon delivery,  the vertex was noted to be wedged deeply into the pelvis.  There were  some adhesions noted from the ovaries to the posterior fundus on each  side.  Delivery without complications.  The patient's postoperative  course was benign without any significant fevers.  The patient did have  some postoperative anemia and was started on some iron during her  hospital course.  The patient became afebrile after delivery.  She had a  JP drain in place, which was removed on ostoperative day #2.  She was  kept in hospital until postoperative day #3 and at that point, she is  still afebrile and was doing well, was felt ready for discharge.  She  was sent home on a regular diet, told to decrease activities, told to  continue her prenatal vitamins and her iron supplement daily,  was given  a prescription for Darvocet-N 100 #24 one every 4-6 hours as needed for  pain, told she could use ibuprofen up to 600 mg every 6 hours as needed  for pain, and was to followup in our office in 4 weeks.  Instructions  and precautions were reviewed with the patient.   LABS ON DISCHARGE:  The patient had a hemoglobin of 7.6, which is down  from a preoperative level of 10.0, white blood cell count of 19.4, and  platelets of 156,000.      Melanie Christian, P.A.-C.      Ilda Mori, M.D.  Electronically Signed    MB/MEDQ  D:  05/22/2008  T:  05/22/2008  Job:  [id number]

## 2010-10-26 ENCOUNTER — Ambulatory Visit: Payer: 59 | Admitting: Family

## 2010-10-26 DIAGNOSIS — Z0289 Encounter for other administrative examinations: Secondary | ICD-10-CM

## 2010-10-31 ENCOUNTER — Ambulatory Visit (INDEPENDENT_AMBULATORY_CARE_PROVIDER_SITE_OTHER): Payer: 59 | Admitting: Family

## 2010-10-31 ENCOUNTER — Encounter: Payer: Self-pay | Admitting: Family

## 2010-10-31 VITALS — BP 104/76 | HR 72 | Temp 98.4°F | Resp 18 | Ht 62.0 in | Wt 260.1 lb

## 2010-10-31 DIAGNOSIS — F329 Major depressive disorder, single episode, unspecified: Secondary | ICD-10-CM

## 2010-10-31 MED ORDER — VENLAFAXINE HCL ER 75 MG PO CP24: 75.0000 mg | ORAL_CAPSULE | Freq: Every day | ORAL | Status: DC

## 2010-10-31 NOTE — Progress Notes (Signed)
Subjective:    Patient ID: Melanie Christian, female    DOB: August 24, 1985, 25 y.o.   MRN: [id number]  HPI  Patient is a 25 year old female who presents today for follow up of her depression.  Depession- on Effexor notes some improvement in her depression, but still "doesn't care."  School is "fine." Bothered by nausea- worse in the AM. Not bothered enough to discontinue.  She does note that she has enjoyed spending time with her son more lately.   Had a physics test today- felt overwhelmed.  Had 2 A's and B during the spring Semester.  ETOH - she reports that she has only been drinking once a week- 3-4 drinks.  She has not yet established with a new psychiatrist.     Review of Systems  See HPI  Past Medical History  Diagnosis Date  . Anemia   . Depression   . GERD (gastroesophageal reflux disease)   . UTI (lower urinary tract infection)   . Obesities, morbid   . Migraines     History   Social History  . Marital Status: Single    Spouse Name: N/A    Number of Children: N/A  . Years of Education: N/A   Occupational History  . Not on file.   Social History Main Topics  . Smoking status: Never Smoker   . Smokeless tobacco: Never Used  . Alcohol Use: 7.0 oz/week    14 drink(s) per week     mixed drinks and vodka when depressed  . Drug Use: Yes     marijuana use  . Sexually Active: Not on file   Other Topics Concern  . 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    Past Surgical History  Procedure Date  . Tonsillectomy 1996  . Cesarean section 04/2008  . Dilation and curettage of uterus 01/2007  . Induced abortion 11/22/09    elective    Family History  Problem Relation Age of Onset  . Arthritis Mother   . Hyperlipidemia Mother   . Hypertension Mother   . Drug abuse Father   . Hypertension Father   . Depression Father     PTSD from [place]  . Polycystic ovary syndrome Sister   . Arthritis Maternal  Grandmother   . Diabetes Maternal Grandmother   . Cancer Maternal Grandmother     stomach  . Hypertension Paternal Grandmother   . Sickle cell trait Other 30    Allergies  Allergen Reactions  . Oxycodone-Acetaminophen     REACTION: itching    Current Outpatient Prescriptions on File Prior to Visit  Medication Sig Dispense Refill  . frovatriptan (FROVA) 2.5 MG tablet Take 2.5 mg by mouth as needed. If recurs, may repeat after 2 hours. Max of 3 tabs in 24 hours. For severe migraine.       Marland Kitchen DISCONTD: venlafaxine (EFFEXOR-XR) 75 MG 24 hr capsule Take 1 capsule (75 mg total) by mouth daily.  30 capsule  1  . DISCONTD: aspirin-acetaminophen-caffeine (EXCEDRIN MIGRAINE) 250-250-65 MG per tablet Take 1 tablet by mouth as needed.        Marland Kitchen DISCONTD: esomeprazole (NEXIUM) 40 MG capsule Take 1 capsule daily 20-30 minutes before dinner meal.       . DISCONTD: HYDROcodone-acetaminophen (VICODIN ES) 7.5-750 MG per tablet       . DISCONTD: norethindrone-ethinyl estradiol (MICROGESTIN FE 1/20) 1-20 MG-MCG per tablet Take 1 tablet by  mouth daily. As directed.         BP 104/76  Pulse 72  Temp(Src) 98.4 F (36.9 C) (Oral)  Resp 18  Ht 5\' 2"  (1.575 m)  Wt 260 lb 2.3 oz (118 kg)  BMI 47.58 kg/m2        Objective:   Physical Exam  Gen: awake, alert, NAD Psych: A and O x 3, calm, pleasant.  Somewhat flat affect, but laughed appropriately.          Assessment & Plan:  20 minutes spent with patient today.   All of this time was spent counseling her on depression.

## 2010-10-31 NOTE — Patient Instructions (Signed)
You will be contacted about your referral to psychiatry.   Follow up in 2 months.

## 2010-10-31 NOTE — Assessment & Plan Note (Signed)
This is improving- though not yet at goal.  I offered to try Viibryd, but she does not want to start at this time due to diarrhea side effect.  She wishes to continue Effexor until she can see psychiatry.

## 2010-12-11 ENCOUNTER — Telehealth: Payer: Self-pay | Admitting: Family

## 2010-12-11 NOTE — Telephone Encounter (Addendum)
Opened in error

## 2010-12-24 ENCOUNTER — Encounter: Payer: Self-pay | Admitting: Family

## 2010-12-24 ENCOUNTER — Other Ambulatory Visit: Payer: Self-pay | Admitting: Family

## 2010-12-24 ENCOUNTER — Telehealth: Payer: Self-pay | Admitting: Family

## 2010-12-24 ENCOUNTER — Ambulatory Visit (INDEPENDENT_AMBULATORY_CARE_PROVIDER_SITE_OTHER): Payer: 59 | Admitting: Family

## 2010-12-24 VITALS — BP 102/72 | HR 72 | Temp 98.3°F | Resp 16 | Ht 62.0 in | Wt 268.0 lb

## 2010-12-24 DIAGNOSIS — R102 Pelvic and perineal pain: Secondary | ICD-10-CM

## 2010-12-24 DIAGNOSIS — N949 Unspecified condition associated with female genital organs and menstrual cycle: Secondary | ICD-10-CM

## 2010-12-24 DIAGNOSIS — A6009 Herpesviral infection of other urogenital tract: Secondary | ICD-10-CM | POA: Insufficient documentation

## 2010-12-24 DIAGNOSIS — A6000 Herpesviral infection of urogenital system, unspecified: Secondary | ICD-10-CM

## 2010-12-24 MED ORDER — ACYCLOVIR 400 MG PO TABS: 400.0000 mg | ORAL_TABLET | Freq: Three times a day (TID) | ORAL | Status: AC

## 2010-12-24 MED ORDER — VALACYCLOVIR HCL 1 G PO TABS: 1000.0000 mg | ORAL_TABLET | Freq: Two times a day (BID) | ORAL | Status: DC

## 2010-12-24 NOTE — Telephone Encounter (Signed)
Left message for pt to return our call.  I checked with the pharmacy and she has a deductible to meet which is why the medication is so expensive. Please let patient know that I have sent acyclovir in its place which will hopefully be cheaper for her.

## 2010-12-24 NOTE — Patient Instructions (Signed)
Please complete blood work on the first floor.  We will contact you with the results of your blood work. Call if your symptoms worsen or if they do not continue to improve.

## 2010-12-24 NOTE — Progress Notes (Signed)
  Subjective:    Patient ID: Melanie Christian, female    DOB: 02/18/1986, 25 y.o.   MRN: [id number]  HPI  Melanie Christian is a 25 yr old bisexual female who presents today with chief complaint of vaginal pain.  She reports that the pain started on 7/4 with vaginal itching.  This started after she had participated in oral sex with her partner the night before.  Initially she thought that it was a reaction to a new scented soap that she was using.  She now notes some associated pain, buring, swelling "around the clitoris." Denies previous history of similar symptoms.   Denies fever. To her knowledge her partner has not had any vaginal or genital sores.  She reports >20 lifetime partners- both men and women.   Review of Systems See HPI  Past Medical History  Diagnosis Date  . Anemia   . Depression   . GERD (gastroesophageal reflux disease)   . UTI (lower urinary tract infection)   . Obesities, morbid   . Migraines     History   Social History  . Marital Status: Single    Spouse Name: N/A    Number of Children: N/A  . Years of Education: N/A   Occupational History  . Not on file.   Social History Main Topics  . Smoking status: Never Smoker   . Smokeless tobacco: Never Used  . Alcohol Use: 7.0 oz/week    14 drink(s) per week     mixed drinks and vodka when depressed  . Drug Use: Yes     marijuana use  . Sexually Active: Not on file   Other Topics Concern  . 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    Past Surgical History  Procedure Date  . Tonsillectomy 1996  . Cesarean section 04/2008  . Dilation and curettage of uterus 01/2007  . Induced abortion 11/22/09    elective    Family History  Problem Relation Age of Onset  . Arthritis Mother   . Hyperlipidemia Mother   . Hypertension Mother   . Drug abuse Father   . Hypertension Father   . Depression Father     PTSD from [place]  . Polycystic ovary syndrome Sister     . Arthritis Maternal Grandmother   . Diabetes Maternal Grandmother   . Cancer Maternal Grandmother     stomach  . Hypertension Paternal Grandmother   . Sickle cell trait Other 30    Allergies  Allergen Reactions  . Oxycodone-Acetaminophen     REACTION: itching    Current Outpatient Prescriptions on File Prior to Visit  Medication Sig Dispense Refill  . venlafaxine (EFFEXOR-XR) 75 MG 24 hr capsule Take 1 capsule (75 mg total) by mouth daily.  30 capsule  1    BP 102/72  Pulse 72  Temp(Src) 98.3 F (36.8 C) (Oral)  Resp 16  Ht 5\' 2"  (1.575 m)  Wt 268 lb (121.564 kg)  BMI 49.02 kg/m2  LMP 12/07/2010       Objective:   Physical Exam  Constitutional: She appears well-developed and well-nourished.  Genitourinary:          Shallow ulcerated lesions around the clitoris and on the left labia.  Some associated swelling is noted.            Assessment & Plan:

## 2010-12-24 NOTE — Telephone Encounter (Signed)
Patient states that per her insurance, the medication that was prescribed today(patient does not know the name of med) is too expensive. Is there anything else that could be called in that is less expensive?

## 2010-12-24 NOTE — Telephone Encounter (Signed)
Please advise 

## 2010-12-24 NOTE — Assessment & Plan Note (Signed)
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  Pt very distraught over this probable diagnosis.  HSV I and II IgG/IgM titers were ordered today.  Pt will be treated with Valtrex to help with her current outbreak.  30 minutes spent with the patient today.  >50% of the time was spent counseling pt on HSV treatment, transmission, course.  Support was provided.  Pt instructed to notify her partner.

## 2010-12-25 NOTE — Telephone Encounter (Signed)
Call placed to patient at [phone], no answer. A detailed voice message was left informing patient per Sandford Craze instructions

## 2010-12-26 ENCOUNTER — Telehealth: Payer: Self-pay | Admitting: *Deleted

## 2010-12-26 NOTE — Telephone Encounter (Signed)
Spoke with [facility] and added test # [phone] (HSV I + II IgM) per Sandford Craze, NP.

## 2010-12-28 ENCOUNTER — Ambulatory Visit: Payer: 59 | Admitting: Family

## 2010-12-28 ENCOUNTER — Ambulatory Visit (INDEPENDENT_AMBULATORY_CARE_PROVIDER_SITE_OTHER): Payer: 59 | Admitting: Family

## 2010-12-28 ENCOUNTER — Encounter: Payer: Self-pay | Admitting: Family

## 2010-12-28 DIAGNOSIS — F329 Major depressive disorder, single episode, unspecified: Secondary | ICD-10-CM

## 2010-12-28 DIAGNOSIS — A6009 Herpesviral infection of other urogenital tract: Secondary | ICD-10-CM

## 2010-12-28 DIAGNOSIS — A6000 Herpesviral infection of urogenital system, unspecified: Secondary | ICD-10-CM

## 2010-12-28 NOTE — Progress Notes (Signed)
Subjective:    Patient ID: Melanie Christian, female    DOB: Jun 13, 1985, 25 y.o.   MRN: [id number]  HPI  Ms.  Christian is a 25 yr old female who presents today for follow up.   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 She is taking Acyclovir.  "mad" at her partner.    Depression-  Feels "fine"  Continues effexor.  ETOH- has been drinking on weekends only.  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     Review of Systems See HPI  Past Medical History  Diagnosis Date  . Anemia   . Depression   . GERD (gastroesophageal reflux disease)   . UTI (lower urinary tract infection)   . Obesities, morbid   . Migraines     History   Social History  . Marital Status: Single    Spouse Name: N/A    Number of Children: N/A  . Years of Education: N/A   Occupational History  . Not on file.   Social History Main Topics  . Smoking status: Never Smoker   . Smokeless tobacco: Never Used  . Alcohol Use: 7.0 oz/week    14 drink(s) per week     mixed drinks and vodka when depressed  . Drug Use: Yes     marijuana use  . Sexually Active: Not on file   Other Topics Concern  . 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    Past Surgical History  Procedure Date  . Tonsillectomy 1996  . Cesarean section 04/2008  . Dilation and curettage of uterus 01/2007  . Induced abortion 11/22/09    elective    Family History  Problem Relation Age of Onset  . Arthritis Mother   . Hyperlipidemia Mother   . Hypertension Mother   . Drug abuse Father   . Hypertension Father   . Depression Father     PTSD from [place]  . Polycystic ovary syndrome Sister   . Arthritis Maternal Grandmother   . Diabetes Maternal Grandmother   . Cancer Maternal Grandmother     stomach  . Hypertension Paternal Grandmother   . Sickle cell trait Other 30    Allergies  Allergen Reactions  . Oxycodone-Acetaminophen     REACTION: itching  .  Ultracet (Tramadol-Acetaminophen) Other (See Comments)    hyperactivity    Current Outpatient Prescriptions on File Prior to Visit  Medication Sig Dispense Refill  . acyclovir (ZOVIRAX) 400 MG tablet Take 1 tablet (400 mg total) by mouth 3 (three) times daily.  21 tablet  0  . ibuprofen (ADVIL,MOTRIN) 200 MG tablet Take 200 mg by mouth as needed.        . venlafaxine (EFFEXOR-XR) 75 MG 24 hr capsule Take 1 capsule (75 mg total) by mouth daily.  30 capsule  1    BP 112/72  Pulse 66  Temp(Src) 97.8 F (36.6 C) (Oral)  Resp 16  Ht 5\' 2"  (1.575 m)  Wt 266 lb (120.657 kg)  BMI 48.65 kg/m2  LMP 12/07/2010       Objective:   Physical Exam  Constitutional: She appears well-developed and well-nourished.  Cardiovascular: Normal rate and regular rhythm.   Pulmonary/Chest: Effort normal and breath sounds normal.  Psychiatric: Her behavior is normal. Judgment and thought content normal.       Tearful briefly upon discussion of herpes diagnosis.  Otherwise normal affect.  Assessment & Plan:   No problem-specific assessment & plan notes found for this encounter.

## 2010-12-28 NOTE — Assessment & Plan Note (Signed)
Her symptoms have improved with Acyclovir.  + HSV IGM results discussed with pt.  Support provided.

## 2010-12-28 NOTE — Assessment & Plan Note (Signed)
Her depression seems improved.  Continue Effexor.

## 2010-12-28 NOTE — Patient Instructions (Signed)
Please follow up in 3 months, sooner if problems or concerns. 

## 2011-03-05 LAB — CBC
HCT: 30.5 — ABNORMAL LOW
Hemoglobin: 8.9 — ABNORMAL LOW
MCHC: 33
MCV: 79.9
Platelets: 156
Platelets: 165
Platelets: 218
RBC: 3.41 — ABNORMAL LOW
RDW: 15.1
WBC: 19.4 — ABNORMAL HIGH
WBC: 21.5 — ABNORMAL HIGH

## 2011-03-05 LAB — CREATININE, SERUM
GFR calc Af Amer: >60
GFR calc non Af Amer: >60

## 2011-03-05 LAB — GLUCOSE, CAPILLARY: Glucose-Capillary: 93

## 2011-03-07 ENCOUNTER — Other Ambulatory Visit: Payer: Self-pay | Admitting: Family

## 2011-03-07 NOTE — Telephone Encounter (Signed)
Pt is due for follow up with [facility] around the end of this month. Please arrange appt. As we could only approve a 30 day supply of Effexor.

## 2011-03-07 NOTE — Telephone Encounter (Signed)
I tried contacting patient, could not leave message.

## 2011-03-08 LAB — DIFFERENTIAL
Basophils Absolute: 0 10*3/uL (ref 0.0–0.1)
Basophils Relative: 0 % (ref 0–1)
Lymphocytes Relative: 11 % — ABNORMAL LOW (ref 12–46)
Neutro Abs: 6.9 10*3/uL (ref 1.7–7.7)
Neutrophils Relative %: 80 % — ABNORMAL HIGH (ref 43–77)

## 2011-03-08 LAB — CBC
HCT: 27.4 % — ABNORMAL LOW (ref 36.0–46.0)
Hemoglobin: 8.6 g/dL — ABNORMAL LOW (ref 12.0–15.0)
MCV: 78.1 fL (ref 78.0–100.0)
Platelets: 428 10*3/uL — ABNORMAL HIGH (ref 150–400)
WBC: 8.6 10*3/uL (ref 4.0–10.5)

## 2011-03-08 LAB — COMPREHENSIVE METABOLIC PANEL
Albumin: 2.6 g/dL — ABNORMAL LOW (ref 3.5–5.2)
BUN: 4 mg/dL — ABNORMAL LOW (ref 6–23)
Chloride: 110 mEq/L (ref 96–112)
Creatinine, Ser: 0.63 mg/dL (ref 0.4–1.2)
GFR calc non Af Amer: >60 mL/min (ref >60)
Glucose, Bld: 110 mg/dL — ABNORMAL HIGH (ref 70–99)
Total Bilirubin: 0.4 mg/dL (ref 0.3–1.2)

## 2011-03-08 LAB — URINE MICROSCOPIC-ADD ON

## 2011-03-08 LAB — URINE CULTURE: Colony Count: 6000

## 2011-03-08 LAB — URINALYSIS, ROUTINE W REFLEX MICROSCOPIC
Glucose, UA: NEGATIVE mg/dL
Hgb urine dipstick: NEGATIVE
Specific Gravity, Urine: 1.015 (ref 1.005–1.030)
Urobilinogen, UA: 0.2 mg/dL (ref 0.0–1.0)

## 2011-03-11 NOTE — Telephone Encounter (Signed)
I could not leave message for patient

## 2011-03-18 LAB — ABO/RH: ABO/RH(D): A POS

## 2011-03-18 LAB — CBC
Platelets: 335
RDW: 15.3 — ABNORMAL HIGH
WBC: 10.4

## 2011-03-18 LAB — URINALYSIS, ROUTINE W REFLEX MICROSCOPIC
Nitrite: NEGATIVE
Specific Gravity, Urine: >1.03 — ABNORMAL HIGH
Urobilinogen, UA: 0.2
pH: 6

## 2011-03-18 LAB — POCT PREGNANCY, URINE: Operator id: 127531

## 2011-03-18 LAB — GC/CHLAMYDIA PROBE AMP, GENITAL: Chlamydia, DNA Probe: NEGATIVE

## 2011-03-18 LAB — WET PREP, GENITAL: Trich, Wet Prep: NONE SEEN

## 2011-03-18 LAB — HCG, QUANTITATIVE, PREGNANCY: hCG, Beta Chain, Quant, S: 7886 — ABNORMAL HIGH

## 2011-03-30 ENCOUNTER — Ambulatory Visit (INDEPENDENT_AMBULATORY_CARE_PROVIDER_SITE_OTHER): Payer: 59 | Admitting: Family Medicine

## 2011-03-30 ENCOUNTER — Encounter: Payer: Self-pay | Admitting: Family Medicine

## 2011-03-30 VITALS — BP 126/84 | HR 76 | Temp 99.6°F | Wt 280.1 lb

## 2011-03-30 DIAGNOSIS — J069 Acute upper respiratory infection, unspecified: Secondary | ICD-10-CM

## 2011-03-30 DIAGNOSIS — J029 Acute pharyngitis, unspecified: Secondary | ICD-10-CM

## 2011-03-30 MED ORDER — LIDOCAINE VISCOUS 2 % MT SOLN: 5.0000 mL | OROMUCOSAL | Status: DC | PRN

## 2011-03-30 NOTE — Progress Notes (Signed)
duration of symptoms:1 day rhinorrhea:no congestion:no ear pain:no sore throat: yes cough:some Myalgias: no other concerns:fatigued.  Hoarse voice.  No fevers >100.4.  ROS: See HPI.  Otherwise negative.    Meds, vitals, and allergies reviewed.   GEN: nad, alert and oriented HEENT: mucous membranes moist, TM w/o erythema, nasal epithelium injected bilaterally, OP with cobblestoning but no exudates NECK: supple w/o LA CV: rrr. PULM: ctab, no inc wob, dry cough noted ABD: soft, +bs, obese   RST neg.

## 2011-03-30 NOTE — Assessment & Plan Note (Signed)
rst neg, likely viral.  Nontoxic. Supportive tx, rest and fluids.  Can use lidocaine viscous for ST prn.  Pt agrees.

## 2011-03-30 NOTE — Patient Instructions (Signed)
Drink plenty of fluids, take tylenol as needed, and gargle with warm salt water for your throat.  Use the lidocaine for your throat. This should gradually improve.  Take care.  Let us know if you have other concerns.

## 2011-04-02 ENCOUNTER — Ambulatory Visit (INDEPENDENT_AMBULATORY_CARE_PROVIDER_SITE_OTHER): Payer: 59 | Admitting: Family

## 2011-04-02 ENCOUNTER — Encounter: Payer: Self-pay | Admitting: Family

## 2011-04-02 VITALS — BP 110/80 | HR 90 | Temp 97.9°F | Resp 18 | Ht 62.0 in | Wt 279.1 lb

## 2011-04-02 DIAGNOSIS — J069 Acute upper respiratory infection, unspecified: Secondary | ICD-10-CM

## 2011-04-02 DIAGNOSIS — F329 Major depressive disorder, single episode, unspecified: Secondary | ICD-10-CM

## 2011-04-02 DIAGNOSIS — R52 Pain, unspecified: Secondary | ICD-10-CM

## 2011-04-02 LAB — POCT RAPID INFLUENZA A&B
Inflenza A Ag: NEGATIVE
Influenza B Ag: NEGATIVE

## 2011-04-02 MED ORDER — VENLAFAXINE HCL ER 150 MG PO CP24: 150.0000 mg | ORAL_CAPSULE | Freq: Every day | ORAL | Status: DC

## 2011-04-02 NOTE — Assessment & Plan Note (Signed)
Rapid flu screen is negative.  Symptoms consistent with viral infection.  Plan supportive measures as outlined in after visit summary.

## 2011-04-02 NOTE — Progress Notes (Signed)
Subjective:    Patient ID: Melanie Christian, female    DOB: 20-Oct-1985, 25 y.o.   MRN: [id number]  HPI  Melanie Christian is a 25 yr old female who presents today with chief complaint of fatigue and myalgia. She was seen in the Saturday clinic for sore throat on Saturday.  She had a rapid strep which was negative at that time.   She has associated sinus congestion.  Symptoms started 3 days ago.  She has had sweating but did not take her temperature.  + associated nasal congestion.  Nasal discharge was dark brown initially and now clear.  Had sore throat on Saturday which went away.  + cough.  Cough is productive.    Depression- she continues effexor.  She reports that she has trouble sleeping.  She stopped seeing psychiatry due to cost.   Review of Systems See HPI  Past Medical History  Diagnosis Date  . Anemia   . Depression   . GERD (gastroesophageal reflux disease)   . UTI (lower urinary tract infection)   . Obesities, morbid   . Migraines     History   Social History  . Marital Status: Single    Spouse Name: N/A    Number of Children: N/A  . Years of Education: N/A   Occupational History  . Not on file.   Social History Main Topics  . Smoking status: Never Smoker   . Smokeless tobacco: Never Used  . Alcohol Use: 7.0 oz/week    14 drink(s) per week     mixed drinks and vodka when depressed  . Drug Use: Yes     marijuana use  . Sexually Active: Not on file   Other Topics Concern  . 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    Past Surgical History  Procedure Date  . Tonsillectomy 1996  . Cesarean section 04/2008  . Dilation and curettage of uterus 01/2007  . Induced abortion 11/22/09    elective    Family History  Problem Relation Age of Onset  . Arthritis Mother   . Hyperlipidemia Mother   . Hypertension Mother   . Drug abuse Father   . Hypertension Father   . Depression Father     PTSD from [place]  .  Polycystic ovary syndrome Sister   . Arthritis Maternal Grandmother   . Diabetes Maternal Grandmother   . Cancer Maternal Grandmother     stomach  . Hypertension Paternal Grandmother   . Sickle cell trait Other 30    Allergies  Allergen Reactions  . Oxycodone-Acetaminophen     REACTION: itching  . Ultracet (Tramadol-Acetaminophen) Other (See Comments)    hyperactivity    Current Outpatient Prescriptions on File Prior to Visit  Medication Sig Dispense Refill  . ibuprofen (ADVIL,MOTRIN) 200 MG tablet Take 200 mg by mouth as needed.        . venlafaxine (EFFEXOR-XR) 75 MG 24 hr capsule take 1 capsule by mouth once daily  30 capsule  0    BP 110/80  Pulse 90  Temp(Src) 97.9 F (36.6 C) (Oral)  Resp 18  Ht 5\' 2"  (1.575 m)  Wt 279 lb 1.9 oz (126.608 kg)  BMI 51.05 kg/m2  SpO2 99%  LMP 02/06/2011       Objective:   Physical Exam  Constitutional: She appears well-developed and well-nourished. No distress.  HENT:  Head: Normocephalic and atraumatic.  Right Ear: Tympanic  membrane and ear canal normal.  Mouth/Throat: No oropharyngeal exudate.       L canal is occluded by cerumen.  Eyes: Pupils are equal, round, and reactive to light.  Neck: Neck supple.  Cardiovascular: Normal rate and regular rhythm.   No murmur heard. Pulmonary/Chest: Effort normal and breath sounds normal. No respiratory distress. She has no wheezes. She has no rales. She exhibits no tenderness.  Musculoskeletal: She exhibits no edema.  Skin: Skin is warm and dry.  Psychiatric: She has a normal mood and affect. Her behavior is normal. Judgment and thought content normal.          Assessment & Plan:  25 minutes spent with the patient today. >50% of this time was spent counseling pt on her depression and treatment.

## 2011-04-02 NOTE — Patient Instructions (Signed)
You may take mucinex-D as needed to help with the congestion. You may take motrin as needed for body aches. Call if you develop fever over 101, if symptoms worsen, or if you are not feeling better by Friday. Start the increased dose of Effexor. Please follow up in 1 month.

## 2011-04-02 NOTE — Assessment & Plan Note (Signed)
She is having insomnia.  Depression is not optimally controlled. She has been unable to tolerate SRI's, cymbalta or viibryd.  Will increase her effexor.

## 2011-05-10 ENCOUNTER — Other Ambulatory Visit: Payer: Self-pay | Admitting: Family

## 2011-05-10 ENCOUNTER — Ambulatory Visit (INDEPENDENT_AMBULATORY_CARE_PROVIDER_SITE_OTHER): Payer: 59 | Admitting: Family

## 2011-05-10 ENCOUNTER — Encounter: Payer: Self-pay | Admitting: Family

## 2011-05-10 VITALS — BP 118/82 | HR 66 | Temp 97.9°F | Resp 16 | Ht 62.0 in | Wt 286.1 lb

## 2011-05-10 DIAGNOSIS — F329 Major depressive disorder, single episode, unspecified: Secondary | ICD-10-CM

## 2011-05-10 DIAGNOSIS — N898 Other specified noninflammatory disorders of vagina: Secondary | ICD-10-CM

## 2011-05-10 DIAGNOSIS — R635 Abnormal weight gain: Secondary | ICD-10-CM | POA: Insufficient documentation

## 2011-05-10 DIAGNOSIS — A6009 Herpesviral infection of other urogenital tract: Secondary | ICD-10-CM

## 2011-05-10 DIAGNOSIS — A6000 Herpesviral infection of urogenital system, unspecified: Secondary | ICD-10-CM

## 2011-05-10 DIAGNOSIS — N912 Amenorrhea, unspecified: Secondary | ICD-10-CM

## 2011-05-10 LAB — POCT URINE PREGNANCY: Preg Test, Ur: NEGATIVE

## 2011-05-10 MED ORDER — VENLAFAXINE HCL ER 150 MG PO CP24: 150.0000 mg | ORAL_CAPSULE | Freq: Every day | ORAL | Status: DC

## 2011-05-10 MED ORDER — NORETHIN ACE-ETH ESTRAD-FE 1-20 MG-MCG PO TABS: 1.0000 | ORAL_TABLET | Freq: Every day | ORAL | Status: DC

## 2011-05-10 NOTE — Progress Notes (Signed)
Subjective:    Patient ID: Melanie Christian, female    DOB: Oct 25, 1985, 25 y.o.   MRN: [id number]  HPI  Vaginal discharge- noticed yesterday.  Denies itching- white thick discharge + odor.    Amenorrhea- LMP September.  She had a normal period in September.    Depression- She feels "great."  Notes that she has had some nausea on the effexor.     Notes weight gain of 20 pounds since July.  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     Review of Systems See HPI  Past Medical History  Diagnosis Date  . Anemia   . Depression   . GERD (gastroesophageal reflux disease)   . UTI (lower urinary tract infection)   . Obesities, morbid   . Migraines     History   Social History  . Marital Status: Single    Spouse Name: N/A    Number of Children: N/A  . Years of Education: N/A   Occupational History  . Not on file.   Social History Main Topics  . Smoking status: Never Smoker   . Smokeless tobacco: Never Used  . Alcohol Use: 7.0 oz/week    14 drink(s) per week     mixed drinks and vodka when depressed  . Drug Use: Yes     marijuana use  . Sexually Active: Not on file   Other Topics Concern  . 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    Past Surgical History  Procedure Date  . Tonsillectomy 1996  . Cesarean section 04/2008  . Dilation and curettage of uterus 01/2007  . Induced abortion 11/22/09    elective    Family History  Problem Relation Age of Onset  . Arthritis Mother   . Hyperlipidemia Mother   . Hypertension Mother   . Drug abuse Father   . Hypertension Father   . Depression Father     PTSD from [place]  . Polycystic ovary syndrome Sister   . Arthritis Maternal Grandmother   . Diabetes Maternal Grandmother   . Cancer Maternal Grandmother     stomach  . Hypertension Paternal Grandmother   . Sickle cell trait Other 30    Allergies  Allergen Reactions  . Oxycodone-Acetaminophen     REACTION:  itching  . Ultracet (Tramadol-Acetaminophen) Other (See Comments)    hyperactivity    Current Outpatient Prescriptions on File Prior to Visit  Medication Sig Dispense Refill  . ibuprofen (ADVIL,MOTRIN) 200 MG tablet Take 200 mg by mouth as needed.          BP 118/82  Pulse 66  Temp(Src) 97.9 F (36.6 C) (Oral)  Resp 16  Ht 5\' 2"  (1.575 m)  Wt 286 lb 1.3 oz (129.765 kg)  BMI 52.32 kg/m2  LMP 02/06/2011       Objective:   Physical Exam  Constitutional: She is oriented to person, place, and time. She appears well-developed and well-nourished.  HENT:  Head: Normocephalic and atraumatic.  Cardiovascular: Normal rate and regular rhythm.   No murmur heard. Pulmonary/Chest: Effort normal and breath sounds normal. No respiratory distress. She has no wheezes. She has no rales. She exhibits no tenderness.  Genitourinary:       No vaginal lesions are noted.  White vaginal discharge with odor is noted.  Neurological: She is alert and oriented to person, place, and time.  Psychiatric: She has a normal mood and affect. Her  behavior is normal. Judgment and thought content normal.          Assessment & Plan:

## 2011-05-10 NOTE — Assessment & Plan Note (Signed)
Pt was counseled on diet, exercise and weight loss. Check TSH.

## 2011-05-10 NOTE — Assessment & Plan Note (Signed)
She reports no further breakouts.

## 2011-05-10 NOTE — Patient Instructions (Addendum)
Restart your birth control pill. Complete your lab work today. Work hard on diet and exercise.  Follow up in 3 months, sooner if problems or concerns.

## 2011-05-10 NOTE — Assessment & Plan Note (Signed)
Pt's sister has PCOS.  Urine HCG is negative.  Check Serum prolactin, TSH,free t3/t4, FSH.  Resume her OCP as she is currently sexually active with men.

## 2011-05-10 NOTE — Assessment & Plan Note (Signed)
This is much improved.  She notes occasional side effect of nausea from the Effexor, but not enough to discontinue.  Continue current dose of Effexor.

## 2011-05-10 NOTE — Progress Notes (Signed)
Addended by: Mervin Kung A on: 05/10/2011 09:48 AM   Modules accepted: Orders

## 2011-05-11 LAB — T3, FREE: T3, Free: 2.6 pg/mL (ref 2.3–4.2)

## 2011-05-11 LAB — GC/CHLAMYDIA PROBE AMP, GENITAL
Chlamydia, DNA Probe: NEGATIVE
GC Probe Amp, Genital: NEGATIVE

## 2011-05-12 ENCOUNTER — Telehealth: Payer: Self-pay | Admitting: Family

## 2011-05-12 MED ORDER — METRONIDAZOLE 0.75 % VA GEL: 1.0000 | Freq: Two times a day (BID) | VAGINAL | Status: AC

## 2011-05-12 NOTE — Telephone Encounter (Signed)
Pls call pt and let her know that her wet prep shows Bacterial vaginosis, a common vaginal infection.  I will rx metrogel x 5 days.

## 2011-05-13 LAB — PROLACTIN: Prolactin: 9.8 ng/mL

## 2011-05-13 NOTE — Telephone Encounter (Signed)
Pt returned my call and was notified of result. Pt requests lab results, advised pt that prolactin is still pending. Called lab and added test. Please advise when all results are final.

## 2011-05-13 NOTE — Telephone Encounter (Signed)
Pt.notified

## 2011-05-13 NOTE — Telephone Encounter (Signed)
Thyroid and other hormone studies are normal. She should call if she does not have return of her period the last week of her birth control pills.

## 2011-05-13 NOTE — Telephone Encounter (Signed)
Left message on machine to return my call. 

## 2011-05-31 ENCOUNTER — Telehealth: Payer: Self-pay | Admitting: *Deleted

## 2011-05-31 NOTE — Telephone Encounter (Signed)
Pt called requesting letter to submit to her school stating that she has depression and the type of depression. Pt wants letter to tell about the affect of her depression. Pt is requesting an appeal for financial aid and they request documentation of any medical condition. Please advise.

## 2011-06-03 ENCOUNTER — Encounter: Payer: Self-pay | Admitting: Family

## 2011-06-03 NOTE — Telephone Encounter (Signed)
Letter is complete and ready for pick up.

## 2011-06-03 NOTE — Telephone Encounter (Signed)
Left detailed message on voicemail that rx is ready to be picked up.

## 2011-06-05 ENCOUNTER — Telehealth: Payer: Self-pay | Admitting: *Deleted

## 2011-06-05 DIAGNOSIS — N926 Irregular menstruation, unspecified: Secondary | ICD-10-CM

## 2011-06-05 NOTE — Telephone Encounter (Signed)
Notified pt and she is agreeable to proceed with referral to GYN.

## 2011-06-05 NOTE — Telephone Encounter (Signed)
Her bleeding should ease off this week.  Let us know if it doesn't.  In the meantime I will plan to refer her to GYN.

## 2011-06-05 NOTE — Telephone Encounter (Signed)
Pt reports menstrual bleeding x 3 weeks, more painful than usual. Having to use 4-5 pads a day and usually uses 2 pads a day.  4 Ibuprofen daily was helping now having to take 4 pills 2-3 times a day. Pt currently on the "brown pills" in her Microgestin pack. Please advise.

## 2011-06-13 ENCOUNTER — Emergency Department (INDEPENDENT_AMBULATORY_CARE_PROVIDER_SITE_OTHER)
Admission: EM | Admit: 2011-06-13 | Discharge: 2011-06-13 | Disposition: A | Discharge status: Home / Self Care | Payer: Self-pay | Source: Home / Self Care | Attending: Emergency Medicine | Admitting: Emergency Medicine

## 2011-06-13 ENCOUNTER — Encounter (HOSPITAL_COMMUNITY): Payer: Self-pay | Admitting: Emergency Medicine

## 2011-06-13 ENCOUNTER — Telehealth: Payer: Self-pay | Admitting: *Deleted

## 2011-06-13 DIAGNOSIS — M542 Cervicalgia: Secondary | ICD-10-CM

## 2011-06-13 DIAGNOSIS — IMO0001 Reserved for inherently not codable concepts without codable children: Secondary | ICD-10-CM

## 2011-06-13 DIAGNOSIS — M791 Myalgia, unspecified site: Secondary | ICD-10-CM

## 2011-06-13 MED ORDER — DICLOFENAC SODIUM 75 MG PO TBEC: 75.0000 mg | DELAYED_RELEASE_TABLET | Freq: Two times a day (BID) | ORAL | Status: DC

## 2011-06-13 MED ORDER — HYDROCODONE-ACETAMINOPHEN 5-325 MG PO TABS: 2.0000 | ORAL_TABLET | ORAL | Status: DC | PRN
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
## 2011-06-13 NOTE — ED Provider Notes (Signed)
Medical screening examination/treatment/procedure(s) were performed by non-physician practitioner and as supervising physician I was immediately available for consultation/collaboration.  Hillery Hunter, MD 06/13/11 [phone]
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
## 2011-06-19 ENCOUNTER — Telehealth: Payer: Self-pay | Admitting: *Deleted

## 2011-06-19 NOTE — Telephone Encounter (Signed)
Pt states school is requesting further documentation re: length of treatment for depression and types of treatment. Pt wants [place] to include in the letter that she has tried multiple medications unsuccessfully, referral to psych without help and that new treatment now seems to be helping. Please call pt when letter is ready for pick up.

## 2011-06-20 ENCOUNTER — Encounter: Payer: Self-pay | Admitting: Family

## 2011-06-20 NOTE — Telephone Encounter (Signed)
Letter is complete.  

## 2011-06-20 NOTE — Telephone Encounter (Signed)
Pt notified and letter left at front desk for pick up.

## 2011-06-20 NOTE — Telephone Encounter (Signed)
Pt called stating she will need the letter by Friday at the latest. Advised pt I will call her when letter is ready for pick up as pt states it cannot be faxed.

## 2011-07-08 ENCOUNTER — Telehealth: Payer: Self-pay | Admitting: *Deleted

## 2011-07-08 MED ORDER — VALACYCLOVIR HCL 500 MG PO TABS: 500.0000 mg | ORAL_TABLET | Freq: Two times a day (BID) | ORAL | Status: DC

## 2011-07-08 NOTE — Telephone Encounter (Signed)
I sent rx for valtrex 500mg  bid x 3 days.  Refills on file for future break outs.

## 2011-07-08 NOTE — Telephone Encounter (Signed)
Received message from pt stating she thinks she is having another herpes outbreak. Reports symptoms not as bad as previous outbreak but is having a lot of vaginal discomfort / irritation, burning with wiping. Pt wants to know if we can send rx to her pharmacy to treat her. Please advise.

## 2011-07-08 NOTE — Telephone Encounter (Signed)
Pt has been notified.

## 2011-08-06 ENCOUNTER — Encounter: Payer: Self-pay | Admitting: Family

## 2011-08-06 ENCOUNTER — Ambulatory Visit (INDEPENDENT_AMBULATORY_CARE_PROVIDER_SITE_OTHER): Payer: 59 | Admitting: Family

## 2011-08-06 DIAGNOSIS — A084 Viral intestinal infection, unspecified: Secondary | ICD-10-CM | POA: Insufficient documentation

## 2011-08-06 DIAGNOSIS — A09 Infectious gastroenteritis and colitis, unspecified: Secondary | ICD-10-CM

## 2011-08-06 MED ORDER — ONDANSETRON HCL 4 MG PO TABS: 4.0000 mg | ORAL_TABLET | Freq: Three times a day (TID) | ORAL | Status: AC | PRN

## 2011-08-06 NOTE — Assessment & Plan Note (Signed)
26 yr old female presents today with chief complaint of diarrhea, nausea.  She is tolerating PO's and symptoms seem to be improving.  Reinforced the importance of adequate fluid intake.  Add zofran prn, pt to call if symptoms worsen, or if not feeling better in 2-3 days.

## 2011-08-06 NOTE — Progress Notes (Signed)
Subjective:    Patient ID: Melanie Christian, female    DOB: 1985-07-01, 26 y.o.   MRN: [id number]  HPI  Ms.  Melanie Christian is a 26 yr old female who presents today with chief complaint of diarrhea and nausea.  Reports that symptoms started 3 days ago. She reports that she has been trying to eat healthy.  Ate some barbecue. Wondered at first if symptoms might be related to the food that she ate. She reports that she has been holding her effexor during this acute episode of nausea because "effexor gives me nausea anyway."  She she denies fever.  Diarrhea 3-4 times a day.  Reports some GI discomfort.  She reports tolerating PO's.  She has had associated vomitting- once yesterday.      Review of Systems See HPI  Past Medical History  Diagnosis Date  . Anemia   . Depression   . GERD (gastroesophageal reflux disease)   . UTI (lower urinary tract infection)   . Obesities, morbid   . Migraines     History   Social History  . Marital Status: Single    Spouse Name: N/A    Number of Children: N/A  . Years of Education: N/A   Occupational History  . Not on file.   Social History Main Topics  . Smoking status: Never Smoker   . Smokeless tobacco: Never Used  . Alcohol Use: 7.0 oz/week    14 drink(s) per week     mixed drinks and vodka when depressed  . Drug Use: No     marijuana use  . Sexually Active: Not on file   Other Topics Concern  . 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    Past Surgical History  Procedure Date  . Tonsillectomy 1996  . Cesarean section 04/2008  . Dilation and curettage of uterus 01/2007  . Induced abortion 11/22/09    elective    Family History  Problem Relation Age of Onset  . Arthritis Mother   . Hyperlipidemia Mother   . Hypertension Mother   . Drug abuse Father   . Hypertension Father   . Depression Father     PTSD from [place]  . Polycystic ovary syndrome Sister   . Arthritis Maternal  Grandmother   . Diabetes Maternal Grandmother   . Cancer Maternal Grandmother     stomach  . Hypertension Paternal Grandmother   . Sickle cell trait Other 30    Allergies  Allergen Reactions  . Hydrocodone-Acetaminophen     REACTION: Dizzy, "loopy"  . Oxycodone-Acetaminophen     REACTION: itching  . Ultracet (Tramadol-Acetaminophen) Other (See Comments)    hyperactivity    Current Outpatient Prescriptions on File Prior to Visit  Medication Sig Dispense Refill  . aspirin-acetaminophen-caffeine (EXCEDRIN MIGRAINE) 250-250-65 MG per tablet Take 2 tablets by mouth as needed.        . diclofenac (VOLTAREN) 75 MG EC tablet Take 1 tablet (75 mg total) by mouth 2 (two) times daily.  14 tablet  0  . diphenhydrAMINE (SOMINEX) 25 MG tablet Take 25 mg by mouth as needed. For headache       . ibuprofen (ADVIL,MOTRIN) 200 MG tablet Take 200 mg by mouth as needed.        . norethindrone-ethinyl estradiol (MICROGESTIN FE 1/20) 1-20 MG-MCG tablet Take 1 tablet by mouth daily. As directed.  1 Package  5  . valACYclovir (VALTREX) 500  MG tablet Take 1 tablet (500 mg total) by mouth 2 (two) times daily.  6 tablet  5  . venlafaxine (EFFEXOR-XR) 150 MG 24 hr capsule Take 1 capsule (150 mg total) by mouth daily.  30 capsule  3    BP 112/86  Pulse 78  Temp(Src) 97.2 F (36.2 C) (Oral)  Resp 16  Wt 287 lb 1.3 oz (130.219 kg)  SpO2 99%  LMP 07/29/2011       Objective:   Physical Exam  Constitutional: She appears well-developed and well-nourished. No distress.  Cardiovascular: Normal rate and regular rhythm.   No murmur heard. Pulmonary/Chest: Effort normal and breath sounds normal. No respiratory distress. She has no wheezes. She has no rales. She exhibits no tenderness.  Musculoskeletal: She exhibits no edema.  Skin: Skin is warm and dry.  Psychiatric: She has a normal mood and affect. Her behavior is normal. Judgment and thought content normal.          Assessment & Plan:

## 2011-08-06 NOTE — Patient Instructions (Signed)
Viral Gastroenteritis Viral gastroenteritis is also known as stomach flu. This condition affects the stomach and intestinal tract. It can cause sudden diarrhea and vomiting. The illness typically lasts 3 to 8 days. Most people develop an immune response that eventually gets rid of the virus. While this natural response develops, the virus can make you quite ill. CAUSES  Many different viruses can cause gastroenteritis, such as rotavirus or noroviruses. You can catch one of these viruses by consuming contaminated food or water. You may also catch a virus by sharing utensils or other personal items with an infected person or by touching a contaminated surface. SYMPTOMS  The most common symptoms are diarrhea and vomiting. These problems can cause a severe loss of body fluids (dehydration) and a body salt (electrolyte) imbalance. Other symptoms may include:  Fever.   Headache.   Fatigue.   Abdominal pain.  DIAGNOSIS  Your caregiver can usually diagnose viral gastroenteritis based on your symptoms and a physical exam. A stool sample may also be taken to test for the presence of viruses or other infections. TREATMENT  This illness typically goes away on its own. Treatments are aimed at rehydration. The most serious cases of viral gastroenteritis involve vomiting so severely that you are not able to keep fluids down. In these cases, fluids must be given through an intravenous line (IV). HOME CARE INSTRUCTIONS   Drink enough fluids to keep your urine clear or pale yellow. Drink small amounts of fluids frequently and increase the amounts as tolerated.   Ask your caregiver for specific rehydration instructions.   Avoid:   Foods high in sugar.   Alcohol.   Carbonated drinks.   Tobacco.   Juice.   Caffeine drinks.   Extremely hot or cold fluids.   Fatty, greasy foods.   Too much intake of anything at one time.   Dairy products until 24 to 48 hours after diarrhea stops.   You may  consume probiotics. Probiotics are active cultures of beneficial bacteria. They may lessen the amount and number of diarrheal stools in adults. Probiotics can be found in yogurt with active cultures and in supplements.   Wash your hands well to avoid spreading the virus.   Only take over-the-counter or prescription medicines for pain, discomfort, or fever as directed by your caregiver. Do not give aspirin to children. Antidiarrheal medicines are not recommended.   Ask your caregiver if you should continue to take your regular prescribed and over-the-counter medicines.   Keep all follow-up appointments as directed by your caregiver.  SEEK IMMEDIATE MEDICAL CARE IF:   You are unable to keep fluids down.   You do not urinate at least once every 6 to 8 hours.   You develop shortness of breath.   You notice blood in your stool or vomit. This may look like coffee grounds.   You have abdominal pain that increases or is concentrated in one small area (localized).   You have persistent vomiting or diarrhea.   You have a fever.   The patient is a child younger than 3 months, and he or she has a fever.   The patient is a child older than 3 months, and he or she has a fever and persistent symptoms.   The patient is a child older than 3 months, and he or she has a fever and symptoms suddenly get worse.   The patient is a baby, and he or she has no tears when crying.  MAKE SURE YOU:     Understand these instructions.   Will watch your condition.   Will get help right away if you are not doing well or get worse.  Document Released: 05/20/2005 Document Revised: 05/09/2011 Document Reviewed: 03/06/2011 [employer] Patient Information 2012 [employer]. 

## 2011-09-25 ENCOUNTER — Encounter: Payer: Self-pay | Admitting: Family

## 2011-09-25 ENCOUNTER — Ambulatory Visit (INDEPENDENT_AMBULATORY_CARE_PROVIDER_SITE_OTHER): Payer: 59 | Admitting: Family

## 2011-09-25 VITALS — BP 102/74 | HR 77 | Temp 98.1°F | Resp 16 | Ht 62.0 in | Wt 290.1 lb

## 2011-09-25 DIAGNOSIS — F3289 Other specified depressive episodes: Secondary | ICD-10-CM

## 2011-09-25 DIAGNOSIS — F329 Major depressive disorder, single episode, unspecified: Secondary | ICD-10-CM

## 2011-09-25 NOTE — Progress Notes (Signed)
Subjective:    Patient ID: Melanie Christian, female    DOB: 02-14-1986, 26 y.o.   MRN: [id number]  HPI  Melanie Christian is a 26 yr old female who presents today to discuss her depression. She reports that she continues effexor, but recently notes mood swings. Sometimes sad/tearful, sometimes yells/curses.  Doesn't want to get out of bed, though she is forcing herself to do so.  She has some insomnia.  Some days she feels very energetic.  Notes that if she has money "I'll spend it."  She describes shopping sprees, recent gambling, and "spending a lot of money at strip clubs."  Pt denies current suicidal ideation. Her female partner has asked her to seek help as she feels that her mood swings are interfering with her relationship.  Review of Systems See HPI  Past Medical History  Diagnosis Date  . Anemia   . Depression   . GERD (gastroesophageal reflux disease)   . UTI (lower urinary tract infection)   . Obesities, morbid   . Migraines     History   Social History  . Marital Status: Single    Spouse Name: N/A    Number of Children: N/A  . Years of Education: N/A   Occupational History  . Not on file.   Social History Main Topics  . Smoking status: Never Smoker   . Smokeless tobacco: Never Used  . Alcohol Use: 7.0 oz/week    14 drink(s) per week     mixed drinks and vodka when depressed  . Drug Use: No     marijuana use  . Sexually Active: Not on file   Other Topics Concern  . 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    Past Surgical History  Procedure Date  . Tonsillectomy 1996  . Cesarean section 04/2008  . Dilation and curettage of uterus 01/2007  . Induced abortion 11/22/09    elective    Family History  Problem Relation Age of Onset  . Arthritis Mother   . Hyperlipidemia Mother   . Hypertension Mother   . Drug abuse Father   . Hypertension Father   . Depression Father     PTSD from [place]  . Polycystic  ovary syndrome Sister   . Arthritis Maternal Grandmother   . Diabetes Maternal Grandmother   . Cancer Maternal Grandmother     stomach  . Hypertension Paternal Grandmother   . Sickle cell trait Other 30    Allergies  Allergen Reactions  . Hydrocodone-Acetaminophen     REACTION: Dizzy, "loopy"  . Oxycodone-Acetaminophen     REACTION: itching  . Ultracet (Tramadol-Acetaminophen) Other (See Comments)    hyperactivity    Current Outpatient Prescriptions on File Prior to Visit  Medication Sig Dispense Refill  . aspirin-acetaminophen-caffeine (EXCEDRIN MIGRAINE) 250-250-65 MG per tablet Take 2 tablets by mouth as needed.        Marland Kitchen ibuprofen (ADVIL,MOTRIN) 200 MG tablet Take 200 mg by mouth as needed.        . valACYclovir (VALTREX) 500 MG tablet Take 1 tablet (500 mg total) by mouth 2 (two) times daily.  6 tablet  5  . venlafaxine (EFFEXOR-XR) 150 MG 24 hr capsule Take 1 capsule (150 mg total) by mouth daily.  30 capsule  3  . diclofenac (VOLTAREN) 75 MG EC tablet Take 1 tablet (75 mg total) by mouth 2 (two) times daily.  14 tablet  0  .  diphenhydrAMINE (SOMINEX) 25 MG tablet Take 25 mg by mouth as needed. For headache       . norethindrone-ethinyl estradiol (MICROGESTIN FE 1/20) 1-20 MG-MCG tablet Take 1 tablet by mouth daily. As directed.  1 Package  5    BP 102/74  Pulse 77  Temp(Src) 98.1 F (36.7 C) (Oral)  Resp 16  Ht 5\' 2"  (1.575 m)  Wt 290 lb 1.9 oz (131.598 kg)  BMI 53.06 kg/m2  SpO2 99%       Objective:   Physical Exam  Constitutional: She appears well-developed and well-nourished. No distress.  Psychiatric: Her speech is normal and behavior is normal. Judgment and thought content normal. Cognition and memory are normal.       Flat affect.  Pleasant          Assessment & Plan:

## 2011-09-25 NOTE — Patient Instructions (Addendum)
You will be contacted about your referral to psychiatry.  

## 2011-09-25 NOTE — Assessment & Plan Note (Addendum)
Deteriorated.  Will refer to psychiatry for further evaluation and management.  Pt instructed to go directly to the ED if she develops recurrent suicidal ideation. She verbalizes understanding. I gave pt the phone number for Dr. Carie Caddy office in [place] and advised her to make an appointment with the therapist and with Dr. Evelene Croon.  She verbalized understanding and is agreeable to do so. In the meantime, continue Effexor.  15 minutes spent with the patient today.  >50% of this time was spent counseling the patient on her depression.

## 2011-10-20 ENCOUNTER — Other Ambulatory Visit: Payer: Self-pay | Admitting: Family

## 2011-10-26 ENCOUNTER — Ambulatory Visit (INDEPENDENT_AMBULATORY_CARE_PROVIDER_SITE_OTHER): Payer: 59 | Admitting: Family Medicine

## 2011-10-26 ENCOUNTER — Encounter: Payer: Self-pay | Admitting: Family Medicine

## 2011-10-26 VITALS — BP 118/80 | HR 95 | Temp 97.5°F | Wt 289.0 lb

## 2011-10-26 DIAGNOSIS — J029 Acute pharyngitis, unspecified: Secondary | ICD-10-CM

## 2011-10-26 DIAGNOSIS — J069 Acute upper respiratory infection, unspecified: Secondary | ICD-10-CM

## 2011-10-26 MED ORDER — AMOXICILLIN 500 MG PO CAPS: 500.0000 mg | ORAL_CAPSULE | Freq: Three times a day (TID) | ORAL | Status: AC

## 2011-10-26 NOTE — Assessment & Plan Note (Signed)
With st and congestion Disc symptomatic care - see instructions on AVS  Update if not starting to improve in a week or if worsening

## 2011-10-26 NOTE — Patient Instructions (Signed)
Strep test is negative today  I think you are getting a bad head cold - and that can start with a sore throat  Take over the counter ibuprofen (advil) with food 2 pills up to every 6 hours for pain and fever  Also try chloraseptic throat spray for sore throat  Drink lots of water and rest If sore throat suddenly worsens or you get a high fever fill px for amoxicillin and update [place]

## 2011-10-26 NOTE — Assessment & Plan Note (Signed)
With some hoarseness-now getting cold symptoms Neg rapid strep  Did px amox - to fill in case of sudden worsening over the weekend, but exp improvement Disc symptomatic care - see instructions on AVS

## 2011-10-26 NOTE — Progress Notes (Signed)
Subjective:    Patient ID: Melanie Christian, female    DOB: 1985/08/05, 26 y.o.   MRN: [id number]  HPI Scratchy throat for 2 days -- took cold ref losenges and zinc and vitamin C Yesterday - throat feels more swollen and woke up with it - worse and really red  No fever now - thought she was hot  Did have some aches/ no chills   Now does have some nasal cong but no post nasal drip  No cough  Ears feel fine   Rapid strep neg  ny quil at night - helps sleep  Can take nsaid   Patient Active Problem List  Diagnoses  . DEPRESSION  . COMMON MIGRAINE  . GERD  . IRRITABLE BOWEL SYNDROME  . General medical examination  . Herpes simplex of female genitalia  . Amenorrhea  . Weight gain  . Viral gastroenteritis  . Sore throat  . Viral URI   Past Medical History  Diagnosis Date  . Anemia   . Depression   . GERD (gastroesophageal reflux disease)   . UTI (lower urinary tract infection)   . Obesities, morbid   . Migraines    Past Surgical History  Procedure Date  . Tonsillectomy 1996  . Cesarean section 04/2008  . Dilation and curettage of uterus 01/2007  . Induced abortion 11/22/09    elective   History  Substance Use Topics  . Smoking status: Never Smoker   . Smokeless tobacco: Never Used  . Alcohol Use: 7.0 oz/week    14 drink(s) per week     mixed drinks and vodka when depressed   Family History  Problem Relation Age of Onset  . Arthritis Mother   . Hyperlipidemia Mother   . Hypertension Mother   . Drug abuse Father   . Hypertension Father   . Depression Father     PTSD from [place]  . Polycystic ovary syndrome Sister   . Arthritis Maternal Grandmother   . Diabetes Maternal Grandmother   . Cancer Maternal Grandmother     stomach  . Hypertension Paternal Grandmother   . Sickle cell trait Other 30   Allergies  Allergen Reactions  . Hydrocodone-Acetaminophen     REACTION: Dizzy, "loopy"  . Oxycodone-Acetaminophen     REACTION: itching  . Ultracet  (Tramadol-Acetaminophen) Other (See Comments)    hyperactivity   Current Outpatient Prescriptions on File Prior to Visit  Medication Sig Dispense Refill  . ibuprofen (ADVIL,MOTRIN) 200 MG tablet Take 200 mg by mouth as needed.        . valACYclovir (VALTREX) 500 MG tablet Take 1 tablet (500 mg total) by mouth 2 (two) times daily.  6 tablet  5  . venlafaxine XR (EFFEXOR-XR) 150 MG 24 hr capsule take 1 capsule by mouth once daily  30 capsule  3       Review of Systems Review of Systems  Constitutional: Negative for fever, appetite change,  and unexpected weight change.pos for fatigue   Eyes: Negative for pain and visual disturbance.  ENT pos for hoarseness and sore throat / nasal cong/ neg for sinus pain  Respiratory: Negative for cough and shortness of breath.   Cardiovascular: Negative for cp or palpitations    Gastrointestinal: Negative for nausea, diarrhea and constipation.  Genitourinary: Negative for urgency and frequency.  Skin: Negative for pallor or rash   Neurological: Negative for weakness, light-headedness, numbness and headaches.  Hematological: Negative for adenopathy. Does not bruise/bleed easily.  Psychiatric/Behavioral: Negative for dysphoric  mood. The patient is not nervous/anxious.          Objective:   Physical Exam  Constitutional: She appears well-developed and well-nourished. No distress.       Obese and well appearing    HENT:  Head: Normocephalic and atraumatic.  Right Ear: External ear normal.  Left Ear: External ear normal.  Mouth/Throat: No oropharyngeal exudate.       Nares boggy/ congested with clear rhinorrhea  Throat - mild post erythema with prominent uvula No ulcers or lesions Hoarse voice No sinus tenderness  Rapid strep test neg  Eyes: Conjunctivae and EOM are normal. Pupils are equal, round, and reactive to light. Right eye exhibits no discharge. Left eye exhibits no discharge.  Neck: Normal range of motion. Neck supple.   Cardiovascular: Normal rate and regular rhythm.        Rate in high 80s at rest  Pulmonary/Chest: Effort normal and breath sounds normal. No respiratory distress. She has no wheezes.  Lymphadenopathy:    She has no cervical adenopathy.  Neurological: She is alert.  Skin: Skin is warm and dry. No rash noted. No erythema. No pallor.  Psychiatric: She has a normal mood and affect.          Assessment & Plan:

## 2011-10-27 ENCOUNTER — Emergency Department (HOSPITAL_COMMUNITY)
Admission: EM | Admit: 2011-10-27 | Discharge: 2011-10-27 | Disposition: A | Discharge status: Home / Self Care | Payer: 59 | Attending: Emergency Medicine | Admitting: Emergency Medicine

## 2011-10-27 ENCOUNTER — Encounter (HOSPITAL_COMMUNITY): Payer: Self-pay | Admitting: *Deleted

## 2011-10-27 DIAGNOSIS — K1370 Unspecified lesions of oral mucosa: Secondary | ICD-10-CM | POA: Insufficient documentation

## 2011-10-27 DIAGNOSIS — J029 Acute pharyngitis, unspecified: Secondary | ICD-10-CM | POA: Insufficient documentation

## 2011-10-27 LAB — MONONUCLEOSIS SCREEN: Mono Screen: NEGATIVE

## 2011-10-27 LAB — RAPID STREP SCREEN (MED CTR MEBANE ONLY): Streptococcus, Group A Screen (Direct): NEGATIVE

## 2011-10-27 MED ORDER — DEXAMETHASONE SODIUM PHOSPHATE 10 MG/ML IJ SOLN
10.0000 mg | Freq: Once | INTRAMUSCULAR | Status: AC
  Administered 2011-10-27: 10 mg via INTRAMUSCULAR
  Filled 2011-10-27: qty 1

## 2011-10-27 NOTE — Discharge Instructions (Signed)
Take motrin or aleve as need. You may use throat lozenges/spray as need for symptom relief. Follow up with primary care doctor in the next couple days if symptoms fail to improve/resolve. Return to ER if worse, unable to swallow, trouble breathing, unilateral pain/swelling, fevers, other concern.      Sore Throat Sore throats may be caused by bacteria and viruses. They may also be caused by:  Smoking.   Pollution.   Allergies.  If a sore throat is due to strep infection (a bacterial infection), you may need:  A throat swab.   A culture test to verify the strep infection.  You will need one of these:  An antibiotic shot.   Oral medicine for a full 10 days.  Strep infection is very contagious. A doctor should check any close contacts who have a sore throat or fever. A sore throat caused by a virus infection will usually last only 3-4 days. Antibiotics will not treat a viral sore throat.  Infectious mononucleosis (a viral disease), however, can cause a sore throat that lasts for up to 3 weeks. Mononucleosis can be diagnosed with blood tests. You must have been sick for at least 1 week in order for the test to give accurate results. HOME CARE INSTRUCTIONS   To treat a sore throat, take mild pain medicine.   Increase your fluids.   Eat a soft diet.   Do not smoke.   Gargling with warm water or salt water (1 tsp. salt in 8 oz. water) can be helpful.   Try throat sprays or lozenges or sucking on hard candy to ease the symptoms.  Call your doctor if your sore throat lasts longer than 1 week.  SEEK IMMEDIATE MEDICAL CARE IF:  You have difficulty breathing.   You have increased swelling in the throat.   You have pain so severe that you are unable to swallow fluids or your saliva.   You have a severe headache, a high fever, vomiting, or a red rash.  Document Released: 06/27/2004 Document Revised: 05/09/2011 Document Reviewed: 05/07/2007 [facility] Patient Information 2012  [place], [place].

## 2011-10-27 NOTE — ED Provider Notes (Signed)
History     CSN: [id number]  Arrival date & time 10/27/11  4098   First MD Initiated Contact with Patient 10/27/11 [phone]      Chief Complaint  Patient presents with  . Oral Swelling    (Consider location/radiation/quality/duration/timing/severity/associated sxs/prior treatment) The history is provided by the patient.  pt c/o sore throat for past 3 days. Hurts to swallow. Diffuse, no unilateral throat pain or swelling. +nasal congestion, rhinorrhea. No sinus pain/pressure. No ear pain. No headache. No neck pain or stiffness. No known ill contacts, no known mono or strep exposure. Denies cough or sob. No vomiting or diarrhea. No fb sensation in throat. No fever/chills.      Past Medical History  Diagnosis Date  . Anemia   . Depression   . GERD (gastroesophageal reflux disease)   . UTI (lower urinary tract infection)   . Obesities, morbid   . Migraines     Past Surgical History  Procedure Date  . Tonsillectomy 1996  . Cesarean section 04/2008  . Dilation and curettage of uterus 01/2007  . Induced abortion 11/22/09    elective    Family History  Problem Relation Age of Onset  . Arthritis Mother   . Hyperlipidemia Mother   . Hypertension Mother   . Drug abuse Father   . Hypertension Father   . Depression Father     PTSD from [place]  . Polycystic ovary syndrome Sister   . Arthritis Maternal Grandmother   . Diabetes Maternal Grandmother   . Cancer Maternal Grandmother     stomach  . Hypertension Paternal Grandmother   . Sickle cell trait Other 30    History  Substance Use Topics  . Smoking status: Never Smoker   . Smokeless tobacco: Never Used  . Alcohol Use: 7.0 oz/week    14 drink(s) per week     mixed drinks and vodka when depressed    OB History    Grav Para Term Preterm Abortions TAB SAB Ect Mult Living                  Review of Systems  Constitutional: Negative for fever.  HENT: Negative for neck pain and neck stiffness.   Respiratory:  Negative for cough and shortness of breath.   Gastrointestinal: Negative for abdominal pain.  Skin: Negative for rash.  Neurological: Negative for headaches.    Allergies  Hydrocodone-acetaminophen; Oxycodone-acetaminophen; and Ultracet  Home Medications   Current Outpatient Rx  Name Route Sig Dispense Refill  . IBUPROFEN 200 MG PO TABS Oral Take 400 mg by mouth every 5 (five) hours as needed. For pain    . VENLAFAXINE HCL ER 150 MG PO CP24  take 1 capsule by mouth once daily 30 capsule 3  . AMOXICILLIN 500 MG PO CAPS Oral Take 1 capsule (500 mg total) by mouth 3 (three) times daily. 30 capsule 0    BP 123/78  Pulse 79  Temp(Src) 97.6 F (36.4 C) (Oral)  Resp 20  SpO2 100%  Physical Exam  Nursing note and vitals reviewed. Constitutional: She is oriented to person, place, and time. She appears well-developed and well-nourished. No distress.  HENT:  Right Ear: External ear normal.  Left Ear: External ear normal.  Nose: Nose normal.       Pharynx erythematous, mild swelling. No asymmetric swelling, or abscess noted. No trismus. No trouble handling secretions or swallowing. No angioedema. +nasal congestion  Eyes: Conjunctivae are normal. Right eye exhibits no discharge. Left eye exhibits  no discharge. No scleral icterus.  Neck: Normal range of motion. Neck supple. No tracheal deviation present.       No stiffness or rigidity  Cardiovascular: Normal rate, regular rhythm, normal heart sounds and intact distal pulses.   Pulmonary/Chest: Effort normal and breath sounds normal. No respiratory distress.  Abdominal: Soft. Normal appearance. She exhibits no distension. There is no tenderness.       No hsm  Musculoskeletal: She exhibits no edema.  Lymphadenopathy:    She has no cervical adenopathy.  Neurological: She is alert and oriented to person, place, and time.       Steady gait  Skin: Skin is warm and dry. No rash noted.  Psychiatric: She has a normal mood and affect.    ED  Course  Procedures (including critical care time)   Labs Reviewed  RAPID STREP SCREEN  MONONUCLEOSIS SCREEN    Results for orders placed during the hospital encounter of 10/27/11  RAPID STREP SCREEN      Component Value Range   Streptococcus, Group A Screen (Direct) NEGATIVE  NEGATIVE   MONONUCLEOSIS SCREEN      Component Value Range   Mono Screen NEGATIVE  NEGATIVE        MDM  Labs checked. Decadron 10 mg im for symptom relief.  Recheck pt comfortable. No trouble breathing or swallowing.           Suzi Roots, MD 10/27/11 [phone]

## 2011-10-27 NOTE — ED Notes (Signed)
Pt states sore scratchy throat for the past 3-4 days. This morning pt woke up and throat felt swollen and like something was blocking throat. Pt has redness to throat but in no respiratory ditress. speaking in full sentence.

## 2011-12-13 ENCOUNTER — Telehealth: Payer: Self-pay | Admitting: Family

## 2011-12-13 NOTE — Telephone Encounter (Signed)
Patient states that she would like a copy of her immunization records. She will pick up when ready.

## 2011-12-16 NOTE — Telephone Encounter (Signed)
Left detailed message on cell# that Tetanus is the only immunization we have on record for pt and copy is being left at the front desk for pick up.

## 2011-12-31 ENCOUNTER — Ambulatory Visit (INDEPENDENT_AMBULATORY_CARE_PROVIDER_SITE_OTHER): Payer: 59 | Admitting: Family

## 2011-12-31 ENCOUNTER — Encounter: Payer: Self-pay | Admitting: Family

## 2011-12-31 ENCOUNTER — Other Ambulatory Visit (HOSPITAL_COMMUNITY)
Admission: RE | Admit: 2011-12-31 | Discharge: 2011-12-31 | Disposition: A | Discharge status: Home / Self Care | Payer: 59 | Source: Ambulatory Visit | Attending: Family | Admitting: Family

## 2011-12-31 VITALS — BP 116/80 | HR 93 | Temp 98.0°F | Resp 18 | Ht 62.75 in | Wt 292.1 lb

## 2011-12-31 DIAGNOSIS — N62 Hypertrophy of breast: Secondary | ICD-10-CM

## 2011-12-31 DIAGNOSIS — Z01419 Encounter for gynecological examination (general) (routine) without abnormal findings: Secondary | ICD-10-CM

## 2011-12-31 DIAGNOSIS — A6009 Herpesviral infection of other urogenital tract: Secondary | ICD-10-CM

## 2011-12-31 DIAGNOSIS — Z0000 Encounter for general adult medical examination without abnormal findings: Secondary | ICD-10-CM

## 2011-12-31 DIAGNOSIS — F319 Bipolar disorder, unspecified: Secondary | ICD-10-CM

## 2011-12-31 DIAGNOSIS — A6000 Herpesviral infection of urogenital system, unspecified: Secondary | ICD-10-CM

## 2011-12-31 LAB — CBC WITH DIFFERENTIAL/PLATELET
Basophils Absolute: 0 10*3/uL (ref 0.0–0.1)
Basophils Relative: 0 % (ref 0–1)
Eosinophils Relative: 1 % (ref 0–5)
HCT: 36.6 % (ref 36.0–46.0)
MCHC: 33.1 g/dL (ref 30.0–36.0)
Monocytes Absolute: 0.7 10*3/uL (ref 0.1–1.0)
Neutro Abs: 4.7 10*3/uL (ref 1.7–7.7)
Platelets: 319 10*3/uL (ref 150–400)
RDW: 15.6 % — ABNORMAL HIGH (ref 11.5–15.5)
WBC: 8.4 10*3/uL (ref 4.0–10.5)

## 2011-12-31 MED ORDER — VALACYCLOVIR HCL 1 G PO TABS: 1000.0000 mg | ORAL_TABLET | Freq: Every day | ORAL | Status: DC

## 2011-12-31 MED ORDER — SUMATRIPTAN SUCCINATE 50 MG PO TABS: ORAL_TABLET | ORAL | Status: DC

## 2011-12-31 NOTE — Assessment & Plan Note (Signed)
Pt counseled on healthy diet, exercise, weight loss. Pap performed today.  Also sent STD screen at pt request.

## 2011-12-31 NOTE — Assessment & Plan Note (Signed)
Trial of daily suppressive therapy with valtrex.

## 2011-12-31 NOTE — Progress Notes (Signed)
Subjective:    Patient ID: Melanie Christian, female    DOB: 09/26/1985, 26 y.o.   MRN: [id number]  HPI  Prevenative-  Reports that her appetite is not as good on her current meds.  Started zumba every other day with niece.  Diet is fair- "eat whatever I feel like eating." Due for pap today.    HSV-  Reports that she has had stress recently.  Had outbreak which she caught early with valtrex and has cleared.  She is inquiring about suppressive therapy.  Has had 2 outbreaks so far in 2013.  Bipolar Disorder- Pt reports that she continues to follow with Dr. Evelene Croon and a therapist.  Both have diagnosed her with bipolar disorder.  She is now on lamictal and carbamazepine and reports that her mood is much improved.    Backpain- pt reports concern re: large breasts.  She reports that she is currently wearing a K sized bra cup.  Also has migraines and wonders if her back pain/large breast size is contributing.  She is requesting a consultation for breast reduction.     Review of Systems  Constitutional: Positive for unexpected weight change.  HENT: Negative for hearing loss and congestion.   Eyes:       Last eye exam 1 year ago.  Respiratory: Negative for cough.   Cardiovascular: Negative for leg swelling.  Gastrointestinal: Negative for vomiting and diarrhea.       + nausea due to new meds  Musculoskeletal:       Reports back pain which she attributes to large breast size  Neurological:       Reports migraines 2x a week.  Reports that her migraines are severe.  Uses advil migraine which works if she catches early.    Psychiatric/Behavioral: Negative for agitation.   Past Medical History  Diagnosis Date  . Anemia   . Depression   . GERD (gastroesophageal reflux disease)   . UTI (lower urinary tract infection)   . Obesities, morbid   . Migraines     History   Social History  . Marital Status: Single    Spouse Name: N/A    Number of Children: N/A  . Years of Education: N/A    Occupational History  . Not on file.   Social History Main Topics  . Smoking status: Never Smoker   . Smokeless tobacco: Never Used  . Alcohol Use: 1.0 oz/week    2 drink(s) per week     Only drinks once a month when she goes to a club. Has couple of drinks at a time.  . Drug Use: No     marijuana use  . Sexually Active: Not on file   Other Topics Concern  . 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 Tonsillectomy 1996  . Cesarean section 04/2008  . Dilation and curettage of uterus 01/2007  . Induced abortion 11/22/09    elective    Family History  Problem Relation Age of Onset  . Arthritis Mother   . Hyperlipidemia Mother   . Hypertension Mother   . Drug abuse Father   . Hypertension Father   . Depression Father     PTSD from [place]  . Polycystic ovary syndrome Sister   . Arthritis Maternal Grandmother   .  Diabetes Maternal Grandmother   . Cancer Maternal Grandmother     stomach  . Hypertension Paternal Grandmother   . Sickle cell trait Other 30    Allergies  Allergen Reactions  . Hydrocodone-Acetaminophen     REACTION: Dizzy, "loopy"  . Oxycodone-Acetaminophen     REACTION: itching  . Ultracet (Tramadol-Acetaminophen) Other (See Comments)    hyperactivity    Current Outpatient Prescriptions on File Prior to Visit  Medication Sig Dispense Refill  . carbamazepine (TEGRETOL XR) 100 MG 12 hr tablet Take 1 to 4 tablets at bedtime.      Marland Kitchen ibuprofen (ADVIL,MOTRIN) 200 MG tablet Take 400 mg by mouth every 5 (five) hours as needed. For pain      . lamoTRIgine (LAMICTAL) 150 MG tablet Take 150 mg by mouth 2 (two) times daily.      . SUMAtriptan (IMITREX) 50 MG tablet One tablet at start of migraine.  May repeat once 2 hours later as needed.  10 tablet  0    BP 116/80  Pulse 93   Temp 98 F (36.7 C) (Oral)  Resp 18  Ht 5' 2.75" (1.594 m)  Wt 292 lb 1.9 oz (132.505 kg)  BMI 52.16 kg/m2  SpO2 99%  LMP 11/27/2011       Objective:   Physical Exam  Physical Exam  Constitutional: She is oriented to person, place, and time. She appears well-developed and well-nourished. No distress.  HENT:  Head: Normocephalic and atraumatic.  Right Ear: Tympanic membrane and ear canal normal.  Left Ear: Tympanic membrane and ear canal normal.  Mouth/Throat: Oropharynx is clear and moist.  Eyes: Pupils are equal, round, and reactive to light. No scleral icterus.  Neck: Normal range of motion. No thyromegaly present.  Cardiovascular: Normal rate and regular rhythm.   No murmur heard. Pulmonary/Chest: Effort normal and breath sounds normal. No respiratory distress. He has no wheezes. She has no rales. She exhibits no tenderness.  Abdominal: Soft. Bowel sounds are normal. He exhibits no distension and no mass. There is no tenderness. There is no rebound and no guarding.  Musculoskeletal: She exhibits no edema.  Lymphadenopathy:    She has no cervical adenopathy.  Neurological: She is alert and oriented to person, place, and time. She has normal reflexes. She exhibits normal muscle tone. Coordination normal.  Skin: Skin is warm and dry.  Psychiatric: She has a normal mood and affect. Her behavior is normal. Judgment and thought content normal.  Breasts: Examined lying Right: Without masses, retractions, discharge or axillary adenopathy.  Left: Without masses, retractions, discharge or axillary adenopathy.  Inguinal/mons: Normal without inguinal adenopathy  External genitalia: Normal  BUS/Urethra/Skene's glands: Normal  Bladder: Normal  Vagina: Normal  Cervix: Normal  Uterus: normal in size, shape and contour. Midline and mobile  Adnexa/parametria:  Rt: Without masses or tenderness.  Lt: Without masses or tenderness.  Anus and perineum: Normal           Assessment  & Plan:         Assessment & Plan:

## 2011-12-31 NOTE — Patient Instructions (Addendum)
Please complete your lab work prior to leaving.  Follow up as needed.

## 2011-12-31 NOTE — Assessment & Plan Note (Signed)
Currently improved.  Defer management to psychiatry.

## 2011-12-31 NOTE — Assessment & Plan Note (Signed)
Pt with large breasts (size K) back pain, breast tenderness.  Requests referral for breast reduction.  Will refer to Plastic Surg

## 2012-01-01 ENCOUNTER — Telehealth: Payer: Self-pay | Admitting: Family

## 2012-01-01 LAB — URINALYSIS, ROUTINE W REFLEX MICROSCOPIC
Bilirubin Urine: NEGATIVE
Ketones, ur: NEGATIVE mg/dL
Nitrite: NEGATIVE
Protein, ur: NEGATIVE mg/dL
Urobilinogen, UA: 0.2 mg/dL (ref 0.0–1.0)

## 2012-01-01 LAB — BASIC METABOLIC PANEL WITH GFR
BUN: 10 mg/dL (ref 6–23)
CO2: 27 mEq/L (ref 19–32)
Calcium: 9.1 mg/dL (ref 8.4–10.5)
Creat: 0.5 mg/dL (ref 0.50–1.10)
GFR, Est African American: >89 mL/min
Glucose, Bld: 79 mg/dL (ref 70–99)
Sodium: 143 mEq/L (ref 135–145)

## 2012-01-01 LAB — HEPATIC FUNCTION PANEL
ALT: 8 U/L (ref 0–35)
AST: 11 U/L (ref 0–37)
Bilirubin, Direct: 0.1 mg/dL (ref 0.0–0.3)
Total Protein: 6.7 g/dL (ref 6.0–8.3)

## 2012-01-01 LAB — TSH: TSH: 3.552 u[IU]/mL (ref 0.350–4.500)

## 2012-01-01 NOTE — Telephone Encounter (Signed)
Pls call pt and let her know that her wet prep shows bacterial vaginosis. This is a common bacterial infection.  I have sent metrogel to her pharmacy.  Also, HIV is neg, syphilis screen neg. Other labs looked good.  Pap/GC/Chlamydia are still pending.

## 2012-01-02 MED ORDER — METRONIDAZOLE 0.75 % VA GEL: 1.0000 | Freq: Two times a day (BID) | VAGINAL | Status: AC

## 2012-01-02 NOTE — Telephone Encounter (Signed)
Sent!

## 2012-01-02 NOTE — Telephone Encounter (Signed)
Notified pt. I do not see that Metrogel went through to pharmacy. Please advise re: strength, directions and quantity. Pt also wants to know why she keeps getting this type of infection and what can she do to prevent further infections?

## 2012-01-02 NOTE — Telephone Encounter (Signed)
Left message on cell# to return my call. 

## 2012-01-03 NOTE — Telephone Encounter (Signed)
Pt advised and notified of steps to decrease recurrence. Also recommended that pt's partner follow up with her doctor for treatment as well. Pt voices understanding.

## 2012-01-06 ENCOUNTER — Other Ambulatory Visit: Payer: Self-pay | Admitting: Family

## 2012-01-06 MED ORDER — LEVONORGEST-ETH ESTRAD 91-DAY 0.1-0.02 & 0.01 MG PO TABS: 1.0000 | ORAL_TABLET | Freq: Every day | ORAL | Status: DC

## 2012-01-06 NOTE — Telephone Encounter (Addendum)
I would recommend that she start a three month pill pack.  She will get period 4 x a year.  Start taking motrin 200mg  TID 3 days before period starts last week of pack. This should help prevent cramping.  Also, pls let her know that her pap smear is normal and gonorrhea/chlamydia screen is negative.

## 2012-01-06 NOTE — Telephone Encounter (Signed)
Pt is requesting refill of Junel. Also states that she continues to have a lot of pain / cramping with her menstrual cycles. Pt has tried taking Ibuprofen 200mg , 4 tablets and does not feel relief. Pt is requesting something to help with her pain?  Please advise.

## 2012-01-07 NOTE — Telephone Encounter (Signed)
Notified pt and she voices understanding. 

## 2012-01-08 ENCOUNTER — Telehealth: Payer: Self-pay | Admitting: Family

## 2012-01-08 DIAGNOSIS — Z0000 Encounter for general adult medical examination without abnormal findings: Secondary | ICD-10-CM

## 2012-01-08 NOTE — Telephone Encounter (Signed)
Could you please ask pt to return fasting to the lab for cholesterol (v70) at her convenience?

## 2012-01-09 NOTE — Telephone Encounter (Signed)
Left message to return my call.  

## 2012-01-09 NOTE — Telephone Encounter (Signed)
Notified pt and she is agreeable to return to the lab fasting within the next 2 weeks. Future lab order entered and copy given to the lab.

## 2012-01-16 ENCOUNTER — Telehealth: Payer: Self-pay | Admitting: Family

## 2012-01-16 ENCOUNTER — Telehealth: Payer: Self-pay | Admitting: *Deleted

## 2012-01-16 DIAGNOSIS — A6000 Herpesviral infection of urogenital system, unspecified: Secondary | ICD-10-CM

## 2012-01-16 NOTE — Telephone Encounter (Signed)
Lab work we did confirmed a new genital herpes infection. We would need to do some additional lab work to see if it was type 1 or type 2( most likely) which is cause for her genital herpes. We can order this test if she wants.

## 2012-01-16 NOTE — Telephone Encounter (Signed)
Pt called back stating she would like to proceed with testing to differentiate which type she has. She reports that her fiance (female partner) is denying current or previous infection with HSV but pt states she was not diagnosed with HSV herself until after she was intimate with her fiance. Pt states she is confused and wants to talk with Provider. Pt is aware that Provider will not be in the office until tomorrow. Please advise.

## 2012-01-16 NOTE — Telephone Encounter (Signed)
Forward  3 pages from [facility] to Dr. Sandford Craze for review on 01-16-12 ym

## 2012-01-16 NOTE — Telephone Encounter (Signed)
Left detailed message on cell # and to call and let us know how she wants to proceed.

## 2012-01-16 NOTE — Telephone Encounter (Signed)
Pt called wanting to know which type of herpes she was diagnosed with. Pt states we can leave detailed message on her voicemail re: this diagnosis if we are unable to reach her in person.  Please advise.

## 2012-01-17 NOTE — Telephone Encounter (Signed)
Attempted to reach pt.  No answer.  Left message requesting call back.

## 2012-01-24 ENCOUNTER — Telehealth: Payer: Self-pay | Admitting: *Deleted

## 2012-01-24 NOTE — Telephone Encounter (Signed)
Spoke with pt. Recommended that she schedule OV on Monday if discharge persists so that we can perform wet prep.

## 2012-01-24 NOTE — Telephone Encounter (Signed)
Called pt.  Questions answered.  Let her know that her lab orders will be left in lab for her to complete at her convenience.

## 2012-01-24 NOTE — Telephone Encounter (Signed)
Pt states she is currently taking camrese (BCP) that was rx couple days after starting meds she started having some cramping and greyish discharge. Sxs did stop but now she is having the same problem. Wanting to know what md might think it is or should she just stop taking BCP... 01/24/12@4 :47pm/LMB

## 2012-02-07 ENCOUNTER — Encounter (HOSPITAL_COMMUNITY): Payer: Self-pay

## 2012-02-07 ENCOUNTER — Emergency Department (HOSPITAL_COMMUNITY)
Admission: EM | Admit: 2012-02-07 | Discharge: 2012-02-07 | Disposition: A | Discharge status: Home / Self Care | Payer: 59 | Attending: Emergency Medicine | Admitting: Emergency Medicine

## 2012-02-07 ENCOUNTER — Emergency Department (HOSPITAL_COMMUNITY): Payer: 59

## 2012-02-07 DIAGNOSIS — D649 Anemia, unspecified: Secondary | ICD-10-CM | POA: Insufficient documentation

## 2012-02-07 DIAGNOSIS — Y92009 Unspecified place in unspecified non-institutional (private) residence as the place of occurrence of the external cause: Secondary | ICD-10-CM | POA: Insufficient documentation

## 2012-02-07 DIAGNOSIS — K219 Gastro-esophageal reflux disease without esophagitis: Secondary | ICD-10-CM | POA: Insufficient documentation

## 2012-02-07 DIAGNOSIS — F319 Bipolar disorder, unspecified: Secondary | ICD-10-CM | POA: Insufficient documentation

## 2012-02-07 DIAGNOSIS — S93409A Sprain of unspecified ligament of unspecified ankle, initial encounter: Secondary | ICD-10-CM | POA: Insufficient documentation

## 2012-02-07 DIAGNOSIS — Z87891 Personal history of nicotine dependence: Secondary | ICD-10-CM | POA: Insufficient documentation

## 2012-02-07 DIAGNOSIS — W19XXXA Unspecified fall, initial encounter: Secondary | ICD-10-CM | POA: Insufficient documentation

## 2012-02-07 HISTORY — DX: Herpesviral infection, unspecified: B00.9

## 2012-02-07 HISTORY — DX: Bipolar disorder, unspecified: F31.9

## 2012-02-07 MED ORDER — IBUPROFEN 800 MG PO TABS: 800.0000 mg | ORAL_TABLET | Freq: Three times a day (TID) | ORAL | Status: AC

## 2012-02-07 NOTE — ED Notes (Signed)
Pt states she feel down 2 stairs & her foot was planted and she feel forward onto her hands to avoid falling on her face. States she heard a "crunch" Swelling to RT ankle/foot.  Splint was placed by ems.  Otherwise, minor abrasions noted to LT knee.  C/O pain from RT shin down into RT foot.

## 2012-02-07 NOTE — ED Provider Notes (Signed)
History     CSN: [id number]  Arrival date & time 02/07/12  1720   First MD Initiated Contact with Patient 02/07/12 2003      Chief Complaint  Patient presents with  . Ankle Pain    (Consider location/radiation/quality/duration/timing/severity/associated sxs/prior treatment) Patient is a 26 y.o. female presenting with ankle pain. The history is provided by the patient. No language interpreter was used.  Ankle Pain  The incident occurred 3 to 5 hours ago. The incident occurred at home. The injury mechanism was a fall. The pain is present in the right ankle and right leg. The quality of the pain is described as throbbing. The pain is moderate. The pain has been fluctuating since onset. Associated symptoms include inability to bear weight. She reports no foreign bodies present. The symptoms are aggravated by activity, bearing weight and palpation. She has tried immobilization and ice for the symptoms. The treatment provided mild relief.    Past Medical History  Diagnosis Date  . Anemia   . Depression   . GERD (gastroesophageal reflux disease)   . UTI (lower urinary tract infection)   . Obesities, morbid   . Migraines   . Bipolar 1 disorder   . Herpes     Past Surgical History  Procedure Date  . Tonsillectomy 1996  . Cesarean section 04/2008  . Dilation and curettage of uterus 01/2007  . Induced abortion 11/22/09    elective    Family History  Problem Relation Age of Onset  . Arthritis Mother   . Hyperlipidemia Mother   . Hypertension Mother   . Drug abuse Father   . Hypertension Father   . Depression Father     PTSD from [place]  . Polycystic ovary syndrome Sister   . Arthritis Maternal Grandmother   . Diabetes Maternal Grandmother   . Cancer Maternal Grandmother     stomach  . Hypertension Paternal Grandmother   . Sickle cell trait Other 30    History  Substance Use Topics  . Smoking status: Former Games developer  . Smokeless tobacco: Never Used  . Alcohol Use: 1.0  oz/week    2 drink(s) per week     Only drinks once a month when she goes to a club. Has couple of drinks at a time.    OB History    Grav Para Term Preterm Abortions TAB SAB Ect Mult Living                  Review of Systems  Musculoskeletal: Positive for arthralgias.  All other systems reviewed and are negative.    Allergies  Hydrocodone-acetaminophen; Oxycodone-acetaminophen; and Ultracet  Home Medications   Current Outpatient Rx  Name Route Sig Dispense Refill  . ADVIL MIGRAINE PO Oral Take 2 tablets by mouth as needed. Migraine.    Marland Kitchen LAMOTRIGINE 150 MG PO TABS Oral Take 150 mg by mouth 2 (two) times daily.    Marland Kitchen LEVONORGEST-ETH ESTRAD 91-DAY 0.1-0.02 & 0.01 MG PO TABS Oral Take 1 tablet by mouth daily. 1 Package 4  . LURASIDONE HCL 80 MG PO TABS Oral Take 80 mg by mouth daily.    . SUMATRIPTAN SUCCINATE 50 MG PO TABS  One tablet at start of migraine.  May repeat once 2 hours later as needed.    Marland Kitchen VALACYCLOVIR HCL 1 G PO TABS Oral Take 1 tablet (1,000 mg total) by mouth daily. 30 tablet 3    BP 107/67  Pulse 93  Temp 98.5 F (36.9 C) (  Oral)  Resp 16  SpO2 100%  LMP 01/07/2012  Physical Exam  Nursing note and vitals reviewed. Constitutional: She is oriented to person, place, and time. She appears well-developed and well-nourished.  HENT:  Head: Normocephalic and atraumatic.  Eyes: Conjunctivae are normal. Pupils are equal, round, and reactive to light.  Neck: Normal range of motion.  Cardiovascular: Normal rate and regular rhythm.   Pulmonary/Chest: Effort normal and breath sounds normal.  Abdominal: Soft. Bowel sounds are normal.  Musculoskeletal: She exhibits tenderness.       Right lower leg: She exhibits tenderness.       Legs:      Feet:  Neurological: She is alert and oriented to person, place, and time.  Skin: Skin is warm and dry.  Psychiatric: She has a normal mood and affect. Her behavior is normal. Judgment and thought content normal.    ED  Course  Procedures (including critical care time)  Labs Reviewed - No data to display No results found.   No diagnosis found.  Ankle sprain.  MDM          Jimmye Norman, NP 02/08/12 [phone]

## 2012-02-08 NOTE — ED Provider Notes (Signed)
Medical screening examination/treatment/procedure(s) were performed by non-physician practitioner and as supervising physician I was immediately available for consultation/collaboration.  Zavien Clubb, MD 02/08/12 0128 

## 2012-03-17 ENCOUNTER — Encounter: Payer: Self-pay | Admitting: Family

## 2012-03-17 ENCOUNTER — Ambulatory Visit (INDEPENDENT_AMBULATORY_CARE_PROVIDER_SITE_OTHER): Payer: 59 | Admitting: Family

## 2012-03-17 VITALS — BP 110/70 | HR 87 | Temp 99.2°F | Resp 12 | Ht 62.75 in | Wt 287.1 lb

## 2012-03-17 DIAGNOSIS — A088 Other specified intestinal infections: Secondary | ICD-10-CM

## 2012-03-17 DIAGNOSIS — Z23 Encounter for immunization: Secondary | ICD-10-CM

## 2012-03-17 DIAGNOSIS — A084 Viral intestinal infection, unspecified: Secondary | ICD-10-CM | POA: Insufficient documentation

## 2012-03-17 MED ORDER — DIPHENOXYLATE-ATROPINE 2.5-0.025 MG PO TABS: 1.0000 | ORAL_TABLET | Freq: Four times a day (QID) | ORAL | Status: DC | PRN

## 2012-03-17 NOTE — Patient Instructions (Addendum)
Viral Gastroenteritis Viral gastroenteritis is also called stomach flu. This illness is caused by a certain type of germ (virus). It can cause sudden watery poop (diarrhea) and throwing up (vomiting). This can cause you to lose body fluids (dehydration). This illness usually lasts for 3 to 8 days. It usually goes away on its own. HOME CARE   Drink enough fluids to keep your pee (urine) clear or pale yellow. Drink small amounts of fluids often.  Ask your doctor how to replace body fluid losses (rehydration).  Avoid:  Foods high in sugar.  Alcohol.  Bubbly (carbonated) drinks.  Tobacco.  Juice.  Caffeine drinks.  Very hot or cold fluids.  Fatty, greasy foods.  Eating too much at one time.  Dairy products until 24 to 48 hours after your watery poop stops.  You may eat foods with active cultures (probiotics). They can be found in some yogurts and supplements.  Wash your hands well to avoid spreading the illness.  Only take medicines as told by your doctor. Do not give aspirin to children. Do not take medicines for watery poop (antidiarrheals).  Ask your doctor if you should keep taking your regular medicines.  Keep all doctor visits as told. GET HELP RIGHT AWAY IF:   You cannot keep fluids down.  You do not pee at least once every 6 to 8 hours.  You are short of breath.  You see blood in your poop or throw up. This may look like coffee grounds.  You have belly (abdominal) pain that gets worse or is just in one small spot (localized).  You keep throwing up or having watery poop.  You have a fever.  The patient is a child younger than 3 months, and he or she has a fever.  The patient is a child older than 3 months, and he or she has a fever and problems that do not go away.  The patient is a child older than 3 months, and he or she has a fever and problems that suddenly get worse.  The patient is a baby, and he or she has no tears when crying. MAKE SURE YOU:     Understand these instructions.  Will watch your condition.  Will get help right away if you are not doing well or get worse. Document Released: 11/06/2007 Document Revised: 08/12/2011 Document Reviewed: 03/06/2011 Exit[employer] Patient Information 2013 [employer].  

## 2012-03-17 NOTE — Progress Notes (Signed)
Subjective:    Patient ID: Melanie Christian, female    DOB: 08-Feb-1986, 26 y.o.   MRN: [id number]  HPI  Ms.  Christian is a 26 yr old female who presents today with chief complaint of nausea.  She reports that  Pepto/imodium, helped some.  Had vomitting.  Watery diarrhea.  Symptoms are not improving.  She also has headache and dizziness.  She denies stomach pain.  Tolerating PO's.  3 stools,  Like water, no blood.      Review of Systems See HPI  Past Medical History  Diagnosis Date  . Anemia   . Depression   . GERD (gastroesophageal reflux disease)   . UTI (lower urinary tract infection)   . Obesities, morbid   . Migraines   . Bipolar 1 disorder   . Herpes     History   Social History  . Marital Status: Single    Spouse Name: N/A    Number of Children: N/A  . Years of Education: N/A   Occupational History  . Not on file.   Social History Main Topics  . Smoking status: Former Games developer  . Smokeless tobacco: Never Used  . Alcohol Use: 1.0 oz/week    2 drink(s) per week     Only drinks once a month when she goes to a club. Has couple of drinks at a time.  . Drug Use: No     marijuana use  . Sexually Active: Yes   Other Topics Concern  . 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 Tonsillectomy 1996  . Cesarean section 04/2008  . Dilation and curettage of uterus 01/2007  . Induced abortion 11/22/09    elective    Family History  Problem Relation Age of Onset  . Arthritis Mother   . Hyperlipidemia Mother   . Hypertension Mother   . Drug abuse Father   . Hypertension Father   . Depression Father     PTSD from [place]  . Polycystic ovary syndrome Sister   . Arthritis Maternal Grandmother   . Diabetes Maternal Grandmother   . Cancer Maternal Grandmother     stomach  . Hypertension  Paternal Grandmother   . Sickle cell trait Other 30    Allergies  Allergen Reactions  . Hydrocodone-Acetaminophen     REACTION: Dizzy, "loopy"  . Oxycodone-Acetaminophen     REACTION: itching  . Ultracet (Tramadol-Acetaminophen) Other (See Comments)    hyperactivity    Current Outpatient Prescriptions on File Prior to Visit  Medication Sig Dispense Refill  . Ibuprofen (ADVIL MIGRAINE PO) Take 2 tablets by mouth as needed. Migraine.      . lamoTRIgine (LAMICTAL) 150 MG tablet Take 150 mg by mouth 2 (two) times daily.      . Levonorgestrel-Ethinyl Estradiol (AMETHIA,CAMRESE) 0.1-0.02 & 0.01 MG tablet Take 1 tablet by mouth daily.  1 Package  4  . lurasidone (LATUDA) 80 MG TABS Take 80 mg by mouth daily.      . SUMAtriptan (IMITREX) 50 MG tablet One tablet at start of migraine.  May repeat once 2 hours later as needed.      . valACYclovir (VALTREX) 1000 MG tablet Take 1 tablet (1,000 mg total) by mouth daily.  30 tablet  3    BP 110/70  Pulse  87  Temp 99.2 F (37.3 C) (Oral)  Resp 12  Ht 5' 2.75" (1.594 m)  Wt 287 lb 1.3 oz (130.219 kg)  BMI 51.26 kg/m2  SpO2 99%  LMP 01/02/2012       Objective:   Physical Exam  Constitutional: She appears well-developed and well-nourished. No distress.  Cardiovascular: Normal rate and regular rhythm.   No murmur heard. Pulmonary/Chest: Effort normal and breath sounds normal. No respiratory distress. She has no wheezes. She has no rales. She exhibits no tenderness.  Psychiatric: She has a normal mood and affect. Her behavior is normal. Judgment and thought content normal.          Assessment & Plan:

## 2012-03-17 NOTE — Assessment & Plan Note (Signed)
Rx with lomotil, recommended that she make sure to drink plenty of fluids, call if symptoms worsen or if no improvement in 2-3 days. Pt verbalizes understanding.  Flu shot today.

## 2012-03-18 ENCOUNTER — Other Ambulatory Visit: Payer: Self-pay | Admitting: Family

## 2012-03-19 ENCOUNTER — Telehealth: Payer: Self-pay | Admitting: *Deleted

## 2012-03-19 NOTE — Telephone Encounter (Signed)
Pt called stating she now has Medicaid to go along with her insurance. She states that she is able to get a larger refill of her Imitrex.

## 2012-03-25 NOTE — Telephone Encounter (Signed)
Attempted to reach pt and left detailed message on cell# to call and let us know if she is needing more than #10 per month to treat her migraines. If so, we will discuss with Provider to determine if she needs to be referred to a specialist for management of migraines as #10 should be sufficient for most common migraines.

## 2012-04-10 ENCOUNTER — Telehealth: Payer: Self-pay | Admitting: *Deleted

## 2012-04-10 MED ORDER — NORETHINDRONE ACET-ETHINYL EST 1.5-30 MG-MCG PO TABS: 1.0000 | ORAL_TABLET | Freq: Every day | ORAL | Status: DC

## 2012-04-10 NOTE — Telephone Encounter (Signed)
Rx sent for generic microgestin.

## 2012-04-10 NOTE — Telephone Encounter (Signed)
Received fax from [facility] ([address]) stating Medicaid (as a secondary plan) will not cover pt's levonorgestrel-ethinyl estadiol. They are requesting an alternative that is not in the 3 month form.  Please advise.

## 2012-05-03 HISTORY — PX: BREAST REDUCTION SURGERY: SHX8

## 2012-05-12 ENCOUNTER — Telehealth: Payer: Self-pay | Admitting: *Deleted

## 2012-05-12 NOTE — Telephone Encounter (Signed)
Please let pt know that I am unaware of any way to "stop her bleeding".  I would recommend that she continue the microgestin.  Breakthrough bleeding should stop within the first 3 months of taking the pill.  If her bleeding is very heavy, or if it is not becoming lighter over the next few weeks, then I will refer her to GYN.

## 2012-05-12 NOTE — Telephone Encounter (Signed)
Left detailed message on cell# and to call tomorrow if any questions.

## 2012-05-12 NOTE — Telephone Encounter (Signed)
Received message from pt stating she has been bleeding for 3 weeks since changing birth control med to generic microgestin. Pt wants to know if we can give her something to stop her bleeding?  Pt has surgery scheduled for Monday and would like something to stop the bleeding before that time. Please advise.

## 2012-09-04 ENCOUNTER — Ambulatory Visit: Payer: Self-pay | Admitting: Family

## 2012-09-07 ENCOUNTER — Ambulatory Visit: Payer: Self-pay | Admitting: Family

## 2012-09-09 ENCOUNTER — Ambulatory Visit (INDEPENDENT_AMBULATORY_CARE_PROVIDER_SITE_OTHER): Payer: 59 | Admitting: Family

## 2012-09-09 ENCOUNTER — Encounter: Payer: Self-pay | Admitting: Family

## 2012-09-09 VITALS — BP 100/72 | HR 85 | Temp 98.2°F | Resp 16 | Wt 271.1 lb

## 2012-09-09 DIAGNOSIS — N760 Acute vaginitis: Secondary | ICD-10-CM | POA: Insufficient documentation

## 2012-09-09 DIAGNOSIS — R197 Diarrhea, unspecified: Secondary | ICD-10-CM

## 2012-09-09 NOTE — Patient Instructions (Addendum)
Please complete stool samples and return to lab. We will contact you with the results of your stool samples and wet prep.

## 2012-09-09 NOTE — Addendum Note (Signed)
Addended by: Mervin Kung A on: 09/09/2012 05:22 PM   Modules accepted: Orders

## 2012-09-09 NOTE — Assessment & Plan Note (Signed)
Given hx of abs, will check stool for c diff. Will also send stool for O and P and culture.

## 2012-09-09 NOTE — Assessment & Plan Note (Signed)
Wet prep performed today.She declines GC/Chlamydia screen.

## 2012-09-09 NOTE — Progress Notes (Signed)
Subjective:    Patient ID: Melanie Christian, female    DOB: December 29, 1985, 27 y.o.   MRN: [id number]  HPI  Ms. Melanie Christian is a 27 yr old female who presents today with chief complaint of vaginal itching.  Itching is intermittent.  She reports associated vaginal odor.  Notes vaginal "moisture."  Started about 2 weeks ago.  No new partners. Same female partner.   Bipolar disorder- on lamictal, was on Safrys- she stopped this.  Has a 3 month follow up appointment. Feels like overall this has been well controlled.  Diarrhea- she reports that she has had intermittent diarrhea x 3 weeks.  Had breast reduction surgery in December and was treated with antibiotics at that time.   Review of Systems    see HPI  Past Medical History  Diagnosis Date  . Anemia   . Depression   . GERD (gastroesophageal reflux disease)   . UTI (lower urinary tract infection)   . Obesities, morbid   . Migraines   . Bipolar 1 disorder   . Herpes     History   Social History  . Marital Status: Single    Spouse Name: N/A    Number of Children: N/A  . Years of Education: N/A   Occupational History  . Not on file.   Social History Main Topics  . Smoking status: Former Games developer  . Smokeless tobacco: Never Used  . Alcohol Use: 1.0 oz/week    2 drink(s) per week     Comment: Only drinks once a month when she goes to a club. Has couple of drinks at a time.  . Drug Use: No     Comment: marijuana use  . Sexually Active: Yes   Other Topics Concern  . Not on file   Social History Narrative   Studying biology at [employer]   Lives with mom, son and niece.   Regular exercise:  Zumba every other day   Caffeine Use:  Tea/soda 1-2 times a week          Past Surgical History  Procedure Laterality Date  . Tonsillectomy  1996  . Cesarean section  04/2008  . Dilation and curettage of uterus  01/2007  . Induced abortion  11/22/09    elective    Family History  Problem Relation Age of Onset  . Arthritis Mother   .  Hyperlipidemia Mother   . Hypertension Mother   . Drug abuse Father   . Hypertension Father   . Depression Father     PTSD from [place]  . Polycystic ovary syndrome Sister   . Arthritis Maternal Grandmother   . Diabetes Maternal Grandmother   . Cancer Maternal Grandmother     stomach  . Hypertension Paternal Grandmother   . Sickle cell trait Other 30    Allergies  Allergen Reactions  . Hydrocodone-Acetaminophen     REACTION: Dizzy, "loopy"  . Oxycodone-Acetaminophen     REACTION: itching  . Ultracet (Tramadol-Acetaminophen) Other (See Comments)    hyperactivity    Current Outpatient Prescriptions on File Prior to Visit  Medication Sig Dispense Refill  . diphenoxylate-atropine (LOMOTIL) 2.5-0.025 MG per tablet Take 1 tablet by mouth 4 (four) times daily as needed for diarrhea or loose stools.  15 tablet  0  . Ibuprofen (ADVIL MIGRAINE PO) Take 2 tablets by mouth as needed. Migraine.      . lamoTRIgine (LAMICTAL) 150 MG tablet Take 100 mg by mouth 3 (three) times daily.       Marland Kitchen  lurasidone (LATUDA) 80 MG TABS Take 80 mg by mouth daily.      . Norethindrone Acetate-Ethinyl Estradiol (JUNEL,LOESTRIN,MICROGESTIN) 1.5-30 MG-MCG tablet Take 1 tablet by mouth daily.  1 Package  5  . SUMAtriptan (IMITREX) 50 MG tablet TAKE 1 TABLET BY MOUTH AT THE START OF THE MIGRAINE,MAY REPEAT  2 HOURS LATER IF NEEDED  10 tablet  2  . valACYclovir (VALTREX) 1000 MG tablet Take 1 tablet (1,000 mg total) by mouth daily.  30 tablet  3   No current facility-administered medications on file prior to visit.    BP 100/72  Pulse 85  Temp(Src) 98.2 F (36.8 C) (Oral)  Resp 16  Wt 271 lb 1.9 oz (122.979 kg)  BMI 48.4 kg/m2  SpO2 98%  LMP 08/19/2012    Objective:   Physical Exam  Constitutional: She is oriented to person, place, and time. She appears well-developed and well-nourished. No distress.  Cardiovascular: Normal rate and regular rhythm.   No murmur heard. Pulmonary/Chest: Effort normal  and breath sounds normal. No respiratory distress. She has no wheezes. She has no rales. She exhibits no tenderness.  Abdominal: Soft. Bowel sounds are normal.  Genitourinary: There is no rash on the right labia. There is no rash on the left labia.  Musculoskeletal: She exhibits no edema.  Neurological: She is alert and oriented to person, place, and time.  Psychiatric: She has a normal mood and affect. Her behavior is normal. Judgment and thought content normal.          Assessment & Plan:

## 2012-09-11 LAB — WET PREP BY MOLECULAR PROBE
Candida species: NEGATIVE
Trichomonas vaginosis: NEGATIVE

## 2012-09-18 ENCOUNTER — Other Ambulatory Visit: Payer: Self-pay | Admitting: Family

## 2012-09-22 ENCOUNTER — Other Ambulatory Visit: Payer: Self-pay | Admitting: Family

## 2012-11-27 IMAGING — CR DG ANKLE COMPLETE 3+V*R*
3 series · 3 of 3 positions shown · non-contrast
Comparison: None.

CLINICAL DATA: Fall.  Ankle injury and pain.

RIGHT ANKLE - COMPLETE 3+ VIEW

[x ankle ap right]
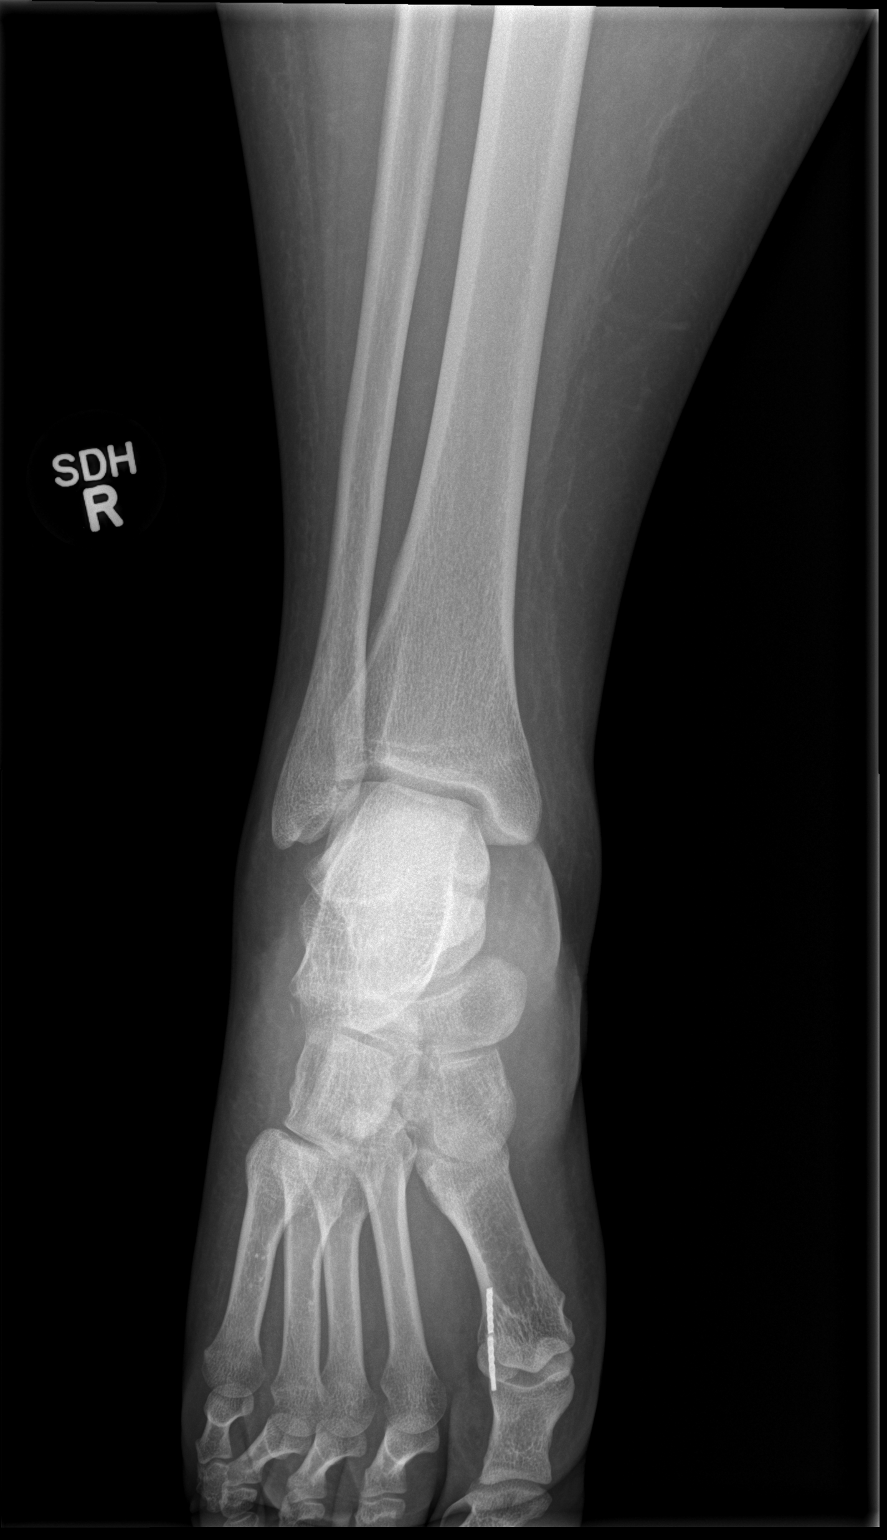

[x ankle obl right]
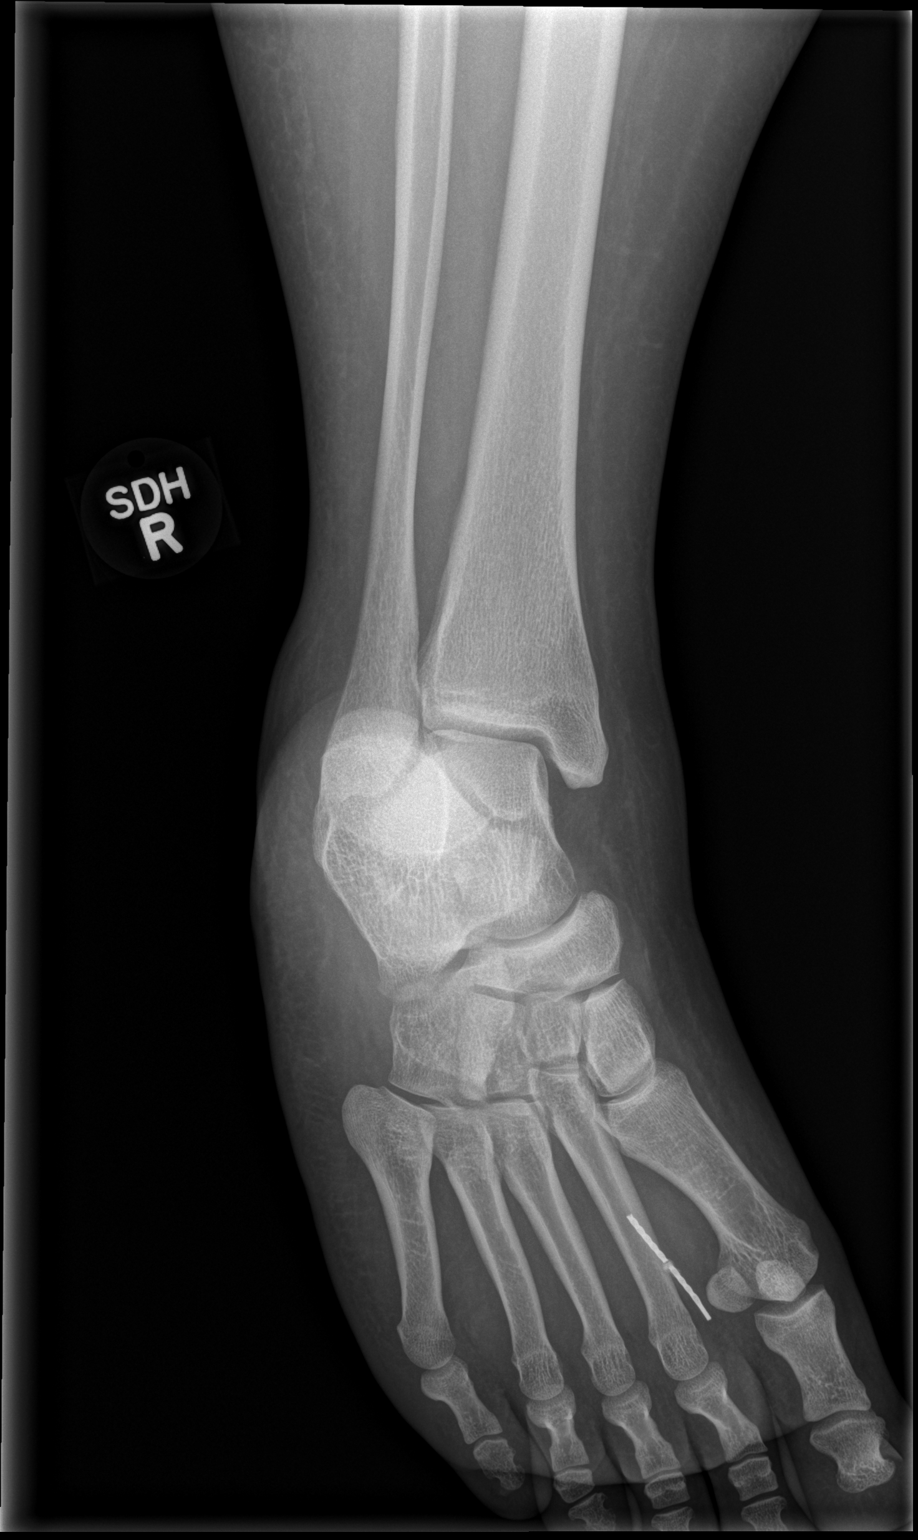

[x ankle lat right]
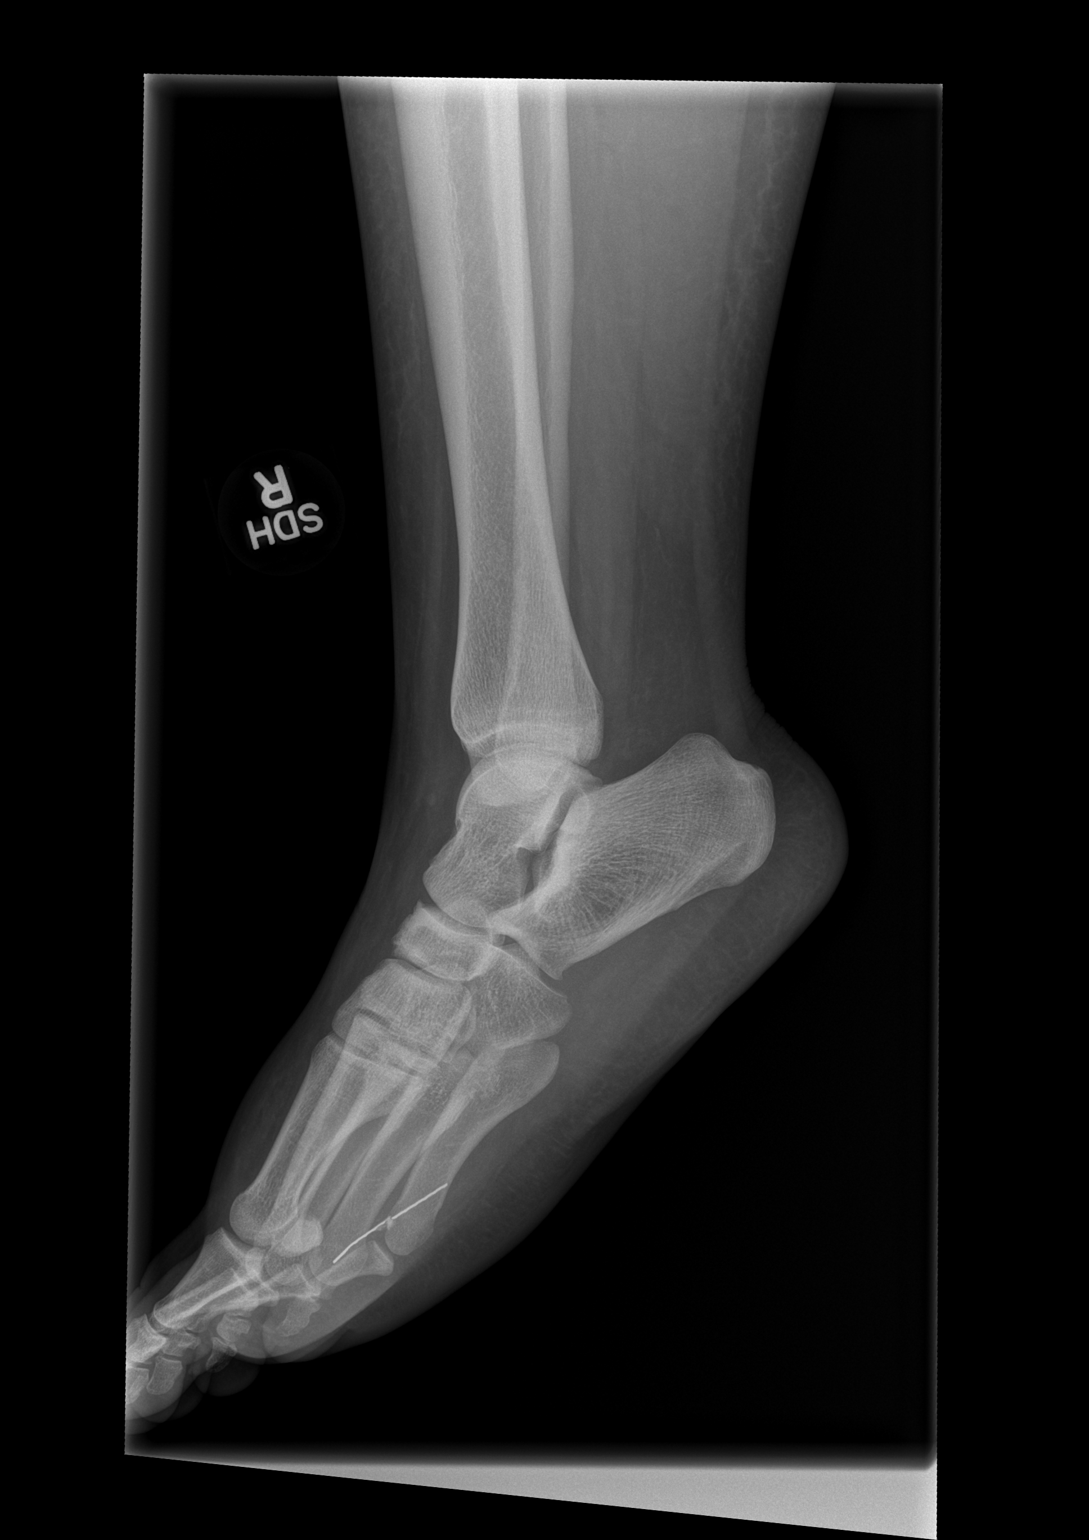

[3 of 3 positions shown; findings below may reference images not displayed]

FINDINGS: No evidence of ankle fracture or dislocation.  No other
significant bone abnormality identified.  No evidence of ankle
joint effusion.

Incidental note is made of a linear metallic foreign body in the
plantar soft tissues at the level of the distal first metatarsal.
IMPRESSION: 1.  No evidence of ankle fracture.
2.  Linear metallic foreign body in the plantar soft tissues at the
level of the distal first metatarsal.

## 2013-02-16 ENCOUNTER — Other Ambulatory Visit: Payer: Self-pay | Admitting: Family

## 2013-03-11 ENCOUNTER — Telehealth: Payer: Self-pay | Admitting: Family

## 2013-03-11 DIAGNOSIS — G43909 Migraine, unspecified, not intractable, without status migrainosus: Secondary | ICD-10-CM

## 2013-03-11 NOTE — Telephone Encounter (Signed)
Please Advise

## 2013-03-11 NOTE — Telephone Encounter (Signed)
Patient would like to be referred to [facility] regarding her migraines.

## 2013-03-24 ENCOUNTER — Ambulatory Visit: Payer: Self-pay | Admitting: Neurology

## 2013-03-25 ENCOUNTER — Ambulatory Visit (INDEPENDENT_AMBULATORY_CARE_PROVIDER_SITE_OTHER): Payer: 59 | Admitting: Neurology

## 2013-03-25 ENCOUNTER — Ambulatory Visit: Payer: Self-pay | Admitting: Neurology

## 2013-03-25 ENCOUNTER — Encounter: Payer: Self-pay | Admitting: Neurology

## 2013-03-25 VITALS — BP 110/66 | Temp 98.7°F | Ht 61.0 in | Wt 272.0 lb

## 2013-03-25 DIAGNOSIS — G43109 Migraine with aura, not intractable, without status migrainosus: Secondary | ICD-10-CM

## 2013-03-25 MED ORDER — PROPRANOLOL HCL 40 MG PO TABS: 40.0000 mg | ORAL_TABLET | Freq: Two times a day (BID) | ORAL | Status: DC

## 2013-03-25 MED ORDER — SUMATRIPTAN SUCCINATE 100 MG PO TABS: 100.0000 mg | ORAL_TABLET | Freq: Once | ORAL | Status: DC | PRN

## 2013-03-25 NOTE — Progress Notes (Signed)
NEUROLOGY CONSULTATION NOTE  Melanie Christian MRN: [id number] DOB: [birth year]  Referring provider: Sandford Craze, NP Primary care provider: Sandford Craze, NP  Reason for consult:  Migraine  HISTORY OF PRESENT ILLNESS: Melanie Christian is a 27 year old right-handed woman with history of anemia, depression, Bipolar I disorder, GERD and obesity who presents for evaluation of migraine..  Records and images were personally reviewed where available.    Onset:  2006 Location: right sided > left-sided, periorbital to back of head, no neck pain Quality:  Dull to throbbing Intensity:  8/10 Associated symptoms:  Nausea, photophobia, phonophobia, osmophobia Aura:  Phantom smells, blurred vision Duration:  2 days (1 hour with medications) Frequency:  Once a week (last migraine 2 weeks ago) Activity:  Able to work if needed, but rather lay down Triggers/exacerbating factors:  Stress, smells (coffee, perfume) Relieving factors:  Rest, dark  Past abortive therapy:  Excedrin migraine (ASA makes nauseous), Goody's, Flexeril Past preventative therapy:  Topiramate (not felt well).  Preventative medications that were used for depression: amitriptyline, Effexor  Current abortive therapy: Advil Migraine first (helps 50% of time).  If ineffective, then Imitrex 50mg .  Usually fails if wakes up with migraine (which is 50% of time). Current preventative therapy:  none She also takes Lamictal 100mg  TID.  Caffeine:  Coffee once a week Sleep hygiene:  Not too well now (stress from school) Stress/depression:  Increased stress from school and raising 40 year old daughter, but depression stable Family history of headache:  Mother, father (aneurysm)  He had a CT of the brain performed on 03/09/13, which was unremarkable.  The right occipital horn looked diminutive, which likely is a normal variant.  PAST MEDICAL HISTORY: Past Medical History  Diagnosis Date  . Anemia   . Depression   . GERD  (gastroesophageal reflux disease)   . UTI (lower urinary tract infection)   . Obesities, morbid   . Migraines   . Bipolar 1 disorder   . Herpes     PAST SURGICAL HISTORY: Past Surgical History  Procedure Laterality Date  . Tonsillectomy  1996  . Cesarean section  04/2008  . Dilation and curettage of uterus  01/2007  . Induced abortion  11/22/09    elective  . Breast reduction surgery  12/13    MEDICATIONS: Current Outpatient Prescriptions on File Prior to Visit  Medication Sig Dispense Refill  . Ibuprofen (ADVIL MIGRAINE PO) Take 2 tablets by mouth as needed. Migraine.      . lamoTRIgine (LAMICTAL) 100 MG tablet Take 1 tablet every morning and 2 tablets at bedtime.      . diphenoxylate-atropine (LOMOTIL) 2.5-0.025 MG per tablet Take 1 tablet by mouth 4 (four) times daily as needed for diarrhea or loose stools.  15 tablet  0  . GILDESS 1.5/30 1.5-30 MG-MCG tablet take 1 tablet by mouth once daily  21 tablet  5   No current facility-administered medications on file prior to visit.    ALLERGIES: Allergies  Allergen Reactions  . Hydrocodone-Acetaminophen     REACTION: Dizzy, "loopy"  . Oxycodone-Acetaminophen     REACTION: itching  . Topamax [Topiramate]   . Ultracet [Tramadol-Acetaminophen] Other (See Comments)    hyperactivity    FAMILY HISTORY: Family History  Problem Relation Age of Onset  . Arthritis Mother   . Hyperlipidemia Mother   . Hypertension Mother   . Drug abuse Father   . Hypertension Father   . Depression Father     PTSD from [place]  .  Polycystic ovary syndrome Sister   . Arthritis Maternal Grandmother   . Diabetes Maternal Grandmother   . Cancer Maternal Grandmother     stomach  . Hypertension Paternal Grandmother   . Sickle cell trait Other 30    SOCIAL HISTORY: History   Social History  . Marital Status: Single    Spouse Name: N/A    Number of Children: N/A  . Years of Education: N/A   Occupational History  . Not on file.   Social  History Main Topics  . Smoking status: Former Games developer  . Smokeless tobacco: Never Used  . Alcohol Use: 1.0 oz/week    2 drink(s) per week     Comment: Only drinks once a month when she goes to a club. Has couple of drinks at a time.  . Drug Use: No     Comment: marijuana use  . Sexual Activity: Yes   Other Topics Concern  . Not on file   Social History Narrative   Studying biology at [employer]   Married- female partner   Regular exercise:  Zumba every other day   Caffeine Use:  Tea/soda 1-2 times a week             REVIEW OF SYSTEMS: Constitutional: No fevers, chills, or sweats, no generalized fatigue, change in appetite Eyes: No visual changes, double vision, eye pain Ear, nose and throat: No hearing loss, ear pain, nasal congestion, sore throat Cardiovascular: No chest pain, palpitations Respiratory:  No shortness of breath at rest or with exertion, wheezes GastrointestinaI: No nausea, vomiting, diarrhea, abdominal pain, fecal incontinence Genitourinary:  No dysuria, urinary retention or frequency Musculoskeletal:  No neck pain, back pain Integumentary: No rash, pruritus, skin lesions Neurological: as above Psychiatric: No depression, insomnia, anxiety Endocrine: No palpitations, fatigue, diaphoresis, mood swings, change in appetite, change in weight, increased thirst Hematologic/Lymphatic:  No anemia, purpura, petechiae. Allergic/Immunologic: no itchy/runny eyes, nasal congestion, recent allergic reactions, rashes  PHYSICAL EXAM: Filed Vitals:   03/25/13 1134  BP: 110/66  Temp: 98.7 F (37.1 C)   General: No acute distress Head:  Normocephalic/atraumatic Neck: supple, no paraspinal tenderness, full range of motion Back: No paraspinal tenderness Heart: regular rate and rhythm Lungs: Clear to auscultation bilaterally. Vascular: No carotid bruits. Neurological Exam: Mental status: alert and oriented to person, place, and time, speech fluent and not dysarthric, language  intact. Cranial nerves: CN I: not tested CN II: pupils equal, round and reactive to light, visual fields intact, fundi unremarkable. CN III, IV, VI:  full range of motion, no nystagmus, no ptosis CN V: facial sensation intact CN VII: upper and lower face symmetric CN VIII: hearing intact CN IX, X: gag intact, uvula midline CN XI: sternocleidomastoid and trapezius muscles intact CN XII: tongue midline Bulk & Tone: normal, no fasciculations. Motor: 5/5 throughout Sensation: temperature and vibration intact Deep Tendon Reflexes: 2+ throughout, toes down Finger to nose testing: normal Heel to shin: normal Gait: normal stance and stride.  Able to walk on toes, heels and in tandem. Romberg negative.  IMPRESSION: Episodic migraine with aura  PLAN: 1.  Start propranolol 40mg  BID for preventative.  Side effects discussed. 2.  For abortive therapy, use Imitrex 100mg .  If wakes up with migraine, use Imitrex with an NSAID (such as Advil or Aleve). 3.  Limit use of pain medications to no more than 2 days out of week to prevent rebound headache. 4.  Stay hydrated, optimize sleep and stress, and keep headache diary. 5.  Follow up in 3 months.  Thank you for allowing me to take part in the care of this patient.  Shon Millet, DO  CC:  Sandford Craze, NP

## 2013-03-25 NOTE — Patient Instructions (Signed)
Migraine Recommendations: 1.  I will give you Imitrex 100mg  pills.  Take at immediate onset of migraine.  May repeat in 2 hours if needed.  If you wake up with a migraine, take an Imitrex with a NSAID such as Advil or Aleve. 2.  Limit use of pain relievers to no more than 2 to 3 days out of the week.  These medications include acetaminophen, ibuprofen, triptans and narcotics.  This will help reduce risk of rebound headaches. 3.  Keep a headache diary. 4.  Stay adequately hydrated. 5.  Maintain good sleep hygiene. 6.  Maintain proper stress management. 7.  For preventative mediation, I will prescribe propranolol 40mg  twice daily.  Side effects may include sleepiness or dizziness.   8.  Call in 6 weeks with update or sooner if needed.  Follow up in 3 months.

## 2013-03-26 ENCOUNTER — Encounter: Payer: Self-pay | Admitting: Neurology

## 2013-03-26 DIAGNOSIS — G43109 Migraine with aura, not intractable, without status migrainosus: Secondary | ICD-10-CM | POA: Insufficient documentation

## 2013-03-31 ENCOUNTER — Ambulatory Visit: Payer: Self-pay | Admitting: Neurology

## 2013-04-15 ENCOUNTER — Other Ambulatory Visit: Payer: Self-pay | Admitting: Physician Assistant

## 2013-04-15 ENCOUNTER — Ambulatory Visit (INDEPENDENT_AMBULATORY_CARE_PROVIDER_SITE_OTHER): Payer: 59 | Admitting: Physician Assistant

## 2013-04-15 ENCOUNTER — Encounter: Payer: Self-pay | Admitting: Physician Assistant

## 2013-04-15 VITALS — BP 122/70 | HR 93 | Temp 98.1°F | Ht 61.0 in | Wt 270.0 lb

## 2013-04-15 DIAGNOSIS — K589 Irritable bowel syndrome without diarrhea: Secondary | ICD-10-CM

## 2013-04-15 DIAGNOSIS — K219 Gastro-esophageal reflux disease without esophagitis: Secondary | ICD-10-CM

## 2013-04-15 DIAGNOSIS — R1031 Right lower quadrant pain: Secondary | ICD-10-CM

## 2013-04-15 DIAGNOSIS — R1013 Epigastric pain: Secondary | ICD-10-CM

## 2013-04-15 DIAGNOSIS — R1032 Left lower quadrant pain: Secondary | ICD-10-CM

## 2013-04-15 LAB — CBC WITH DIFFERENTIAL/PLATELET
Basophils Relative: 1 % (ref 0–1)
Eosinophils Absolute: 0.2 10*3/uL (ref 0.0–0.7)
Eosinophils Relative: 2 % (ref 0–5)
Hemoglobin: 12.3 g/dL (ref 12.0–15.0)
MCH: 25.4 pg — ABNORMAL LOW (ref 26.0–34.0)
MCHC: 32.4 g/dL (ref 30.0–36.0)
MCV: 78.4 fL (ref 78.0–100.0)
Monocytes Absolute: 0.5 10*3/uL (ref 0.1–1.0)
Monocytes Relative: 7 % (ref 3–12)
Neutrophils Relative %: 49 % (ref 43–77)

## 2013-04-15 LAB — POCT URINALYSIS DIPSTICK
Leukocytes, UA: NEGATIVE
Urobilinogen, UA: 0.2
pH, UA: 6.5

## 2013-04-15 LAB — TSH: TSH: 3.437 u[IU]/mL (ref 0.350–4.500)

## 2013-04-15 LAB — POCT URINE PREGNANCY: Preg Test, Ur: NEGATIVE

## 2013-04-15 LAB — LIPASE: Lipase: 11 U/L (ref 0–75)

## 2013-04-15 LAB — COMPREHENSIVE METABOLIC PANEL
Alkaline Phosphatase: 98 U/L (ref 39–117)
BUN: 10 mg/dL (ref 6–23)
Creat: 0.51 mg/dL (ref 0.50–1.10)
Glucose, Bld: 85 mg/dL (ref 70–99)
Sodium: 137 mEq/L (ref 135–145)
Total Bilirubin: 0.2 mg/dL — ABNORMAL LOW (ref 0.3–1.2)

## 2013-04-15 MED ORDER — OMEPRAZOLE 20 MG PO CPDR: 20.0000 mg | DELAYED_RELEASE_CAPSULE | Freq: Every day | ORAL | Status: DC

## 2013-04-15 NOTE — Assessment & Plan Note (Signed)
Physical exam within normal limits.  Encourage fiber supplement, probiotic, increased fluid intake.  Abdominal discomfort is probably stemming from IBS.

## 2013-04-15 NOTE — Progress Notes (Signed)
Patient ID: Melanie Christian, female   DOB: [birth year], 27 y.o.   MRN: [id number]  Patient presents to clinic today c/o diffuse abdominal tenderness, described as a burning pain x 1 month.  Patient also reports mild fatigue, alternating constipation and diarrhea. Patient states the pain does not change with food.  Denies change in appetite.  Denies history of excess NSAID use.  Denies history of PUD or gastritis.  Patient does have history of IBS. Patient does not take fiber supplement or probiotic.  Patient denies melena or hematochezia. Patient denies fever, chills, weight loss.  Patient also endorses suprapubic pressure x 2 days.  Patient denies urinary urgency, frequency, hematuria.  Patient denies vaginal pain, pruritus, discharge or lesion.  Patient denies pregnancy given that she is a lesbian.  Denies being currently sexually active.  According to patient she was on Gildess in the past to help regulate cycles and for dysmenorrhea, but states she has been off of the medicine for about 2 months.  Patient states she is 6 days overdue for her period.  Patient has been reading online and is concerned about the possibility of ovarian cancer.  Denies family history of ovarian cancer.    Of note, patient also with history of depression and bipolar disorder.   Past Medical History  Diagnosis Date  . Anemia   . Depression   . GERD (gastroesophageal reflux disease)   . UTI (lower urinary tract infection)   . Obesities, morbid   . Migraines   . Bipolar 1 disorder   . Herpes     Current Outpatient Prescriptions on File Prior to Visit  Medication Sig Dispense Refill  . diphenoxylate-atropine (LOMOTIL) 2.5-0.025 MG per tablet Take 1 tablet by mouth 4 (four) times daily as needed for diarrhea or loose stools.  15 tablet  0  . GILDESS 1.5/30 1.5-30 MG-MCG tablet take 1 tablet by mouth once daily  21 tablet  5  . Ibuprofen (ADVIL MIGRAINE PO) Take 2 tablets by mouth as needed. Migraine.      . lamoTRIgine  (LAMICTAL) 100 MG tablet Take 1 tablet every morning and 2 tablets at bedtime.      . propranolol (INDERAL) 40 MG tablet Take 1 tablet (40 mg total) by mouth 2 (two) times daily.  60 tablet  6  . SUMAtriptan (IMITREX) 100 MG tablet Take 1 tablet (100 mg total) by mouth once as needed for migraine. May repeat in 2 hours if headache persists or recurs.  10 tablet  6   No current facility-administered medications on file prior to visit.    Allergies  Allergen Reactions  . Hydrocodone-Acetaminophen     REACTION: Dizzy, "loopy"  . Oxycodone-Acetaminophen     REACTION: itching  . Topamax [Topiramate]   . Ultracet [Tramadol-Acetaminophen] Other (See Comments)    hyperactivity    Family History  Problem Relation Age of Onset  . Arthritis Mother   . Hyperlipidemia Mother   . Hypertension Mother   . Drug abuse Father   . Hypertension Father   . Depression Father     PTSD from [place]  . Polycystic ovary syndrome Sister   . Arthritis Maternal Grandmother   . Diabetes Maternal Grandmother   . Cancer Maternal Grandmother     stomach  . Hypertension Paternal Grandmother   . Sickle cell trait Other 30    History   Social History  . Marital Status: Single    Spouse Name: N/A    Number of Children: N/A  .  Years of Education: N/A   Social History Main Topics  . Smoking status: Former Games developer  . Smokeless tobacco: Never Used  . Alcohol Use: 1.0 oz/week    2 drink(s) per week     Comment: Only drinks once a month when she goes to a club. Has couple of drinks at a time.  . Drug Use: No     Comment: marijuana use  . Sexual Activity: Yes   Other Topics Concern  . None   Social History Narrative   Studying biology at [employer]   Married- female partner   Regular exercise:  Zumba every other day   Caffeine Use:  Tea/soda 1-2 times a week            ROS See HPI. All other ROS are negative.  Filed Vitals:   04/15/13 0911  BP: 122/70  Pulse: 93  Temp: 98.1 F (36.7 C)     Physical Exam  Vitals reviewed. Constitutional: She is oriented to person, place, and time.  Obese, well-developed in no acute distress  HENT:  Head: Normocephalic and atraumatic.  Nose: Nose normal.  Mouth/Throat: Oropharynx is clear and moist. No oropharyngeal exudate.  Eyes: Conjunctivae are normal.  Neck: Neck supple.  Cardiovascular: Normal rate, regular rhythm, normal heart sounds and intact distal pulses.   Pulmonary/Chest: Effort normal and breath sounds normal. No respiratory distress. She has no wheezes. She has no rales. She exhibits no tenderness.  Abdominal: Soft. Bowel sounds are normal. She exhibits no distension and no mass. There is no tenderness. There is no rebound and no guarding.  Genitourinary: Vagina normal, uterus normal, cervix normal, right adnexa normal and left adnexa normal. No vaginal discharge found.  Lymphadenopathy:    She has no cervical adenopathy.  Neurological: She is alert and oriented to person, place, and time.  Skin: Skin is warm and dry. No rash noted.  Psychiatric: Affect normal.   Urine dipstick --  Trace hgb on dipstick.  Negative for nitrites or LEs.  Will send for micro  Urine pregnancy -- negative  Assessment/Plan: IRRITABLE BOWEL SYNDROME Physical exam within normal limits.  Encourage fiber supplement, probiotic, increased fluid intake.  Abdominal discomfort is probably stemming from IBS.  GERD Omeprazole daily.  Avoid alcohol consumption.  Will obtain CBC, BMP, lipase, H. pylori  Bilateral lower abdominal pain CBC, BMP, US abdomen and pelvis.  Pelvic exam within normal limits.  Patient endorses overdue for period by 6 days.  Urine pregnancy negative.  Patient not currently sexually active.  Reassured patient that more than likely her period will occur.

## 2013-04-15 NOTE — Patient Instructions (Addendum)
Please obtain labs.  I will call you with your results.  Stop by the front desk for instructions for your ultrasound. I will call you with those results. Please take Prilosec (omeprazole) daily.  Increase fluid intake.  Take a fiber supplement and daily probiotic (Activia, Align, Culturelle). Avoid alcohol consumption or spicy foods.

## 2013-04-15 NOTE — Assessment & Plan Note (Signed)
Omeprazole daily.  Avoid alcohol consumption.  Will obtain CBC, BMP, lipase, H. pylori

## 2013-04-15 NOTE — Assessment & Plan Note (Signed)
CBC, BMP, US abdomen and pelvis.  Pelvic exam within normal limits.  Patient endorses overdue for period by 6 days.  Urine pregnancy negative.  Patient not currently sexually active.  Reassured patient that more than likely her period will occur.

## 2013-04-15 NOTE — Progress Notes (Signed)
Pre visit review using our clinic review tool, if applicable. No additional management support is needed unless otherwise documented below in the visit note. abcess

## 2013-04-16 ENCOUNTER — Other Ambulatory Visit: Payer: Self-pay | Admitting: Physician Assistant

## 2013-04-16 ENCOUNTER — Ambulatory Visit (HOSPITAL_BASED_OUTPATIENT_CLINIC_OR_DEPARTMENT_OTHER)
Admission: RE | Admit: 2013-04-16 | Discharge: 2013-04-16 | Disposition: A | Discharge status: Home / Self Care | Payer: 59 | Source: Ambulatory Visit | Attending: Physician Assistant | Admitting: Physician Assistant

## 2013-04-16 ENCOUNTER — Ambulatory Visit (HOSPITAL_BASED_OUTPATIENT_CLINIC_OR_DEPARTMENT_OTHER): Payer: 59

## 2013-04-16 DIAGNOSIS — R1031 Right lower quadrant pain: Secondary | ICD-10-CM

## 2013-04-16 DIAGNOSIS — R109 Unspecified abdominal pain: Secondary | ICD-10-CM | POA: Insufficient documentation

## 2013-04-16 DIAGNOSIS — N390 Urinary tract infection, site not specified: Secondary | ICD-10-CM

## 2013-04-16 DIAGNOSIS — R1013 Epigastric pain: Secondary | ICD-10-CM

## 2013-04-16 DIAGNOSIS — A048 Other specified bacterial intestinal infections: Secondary | ICD-10-CM

## 2013-04-16 DIAGNOSIS — N72 Inflammatory disease of cervix uteri: Secondary | ICD-10-CM | POA: Insufficient documentation

## 2013-04-16 LAB — H. PYLORI ANTIBODY, IGG: H Pylori IgG: 1.46 {ISR} — ABNORMAL HIGH

## 2013-04-16 LAB — URINALYSIS, MICROSCOPIC ONLY

## 2013-04-16 MED ORDER — AMOXICILLIN 500 MG PO TABS: ORAL_TABLET | ORAL | Status: DC

## 2013-04-16 MED ORDER — CLARITHROMYCIN 500 MG PO TABS: 500.0000 mg | ORAL_TABLET | Freq: Two times a day (BID) | ORAL | Status: DC

## 2013-04-16 MED ORDER — OMEPRAZOLE 20 MG PO CPDR: 20.0000 mg | DELAYED_RELEASE_CAPSULE | Freq: Two times a day (BID) | ORAL | Status: DC

## 2013-04-16 NOTE — Telephone Encounter (Addendum)
Please inform patient that her Abdominal US showed no worrisome findings.  I am waiting on the results of her Pelvic US that will assess her ovaries.  Her urine micro came back positive for bacteria.  Her H. Pylori test came back positive.  Will need to start her on Eradication therapy.  Will send in Rx for Amoxicillin 1000 mg BID x 10 days, Clarithromycin 500 mg BID x 10 days, and her Omeprazole (prilosec) 20 mg BID x 10 days.  The amoxicillin should help with her UTI as well.  I would like to see her in 2 weeks to assess her symptoms.

## 2013-04-16 NOTE — Telephone Encounter (Signed)
Spoke with patient personally. Patient voices understanding.  Will follow-up in 2 weeks.

## 2013-04-21 ENCOUNTER — Telehealth: Payer: Self-pay | Admitting: *Deleted

## 2013-04-21 NOTE — Telephone Encounter (Signed)
Notified pt. She voices understanding and scheduled appt with Selena Batten for tomorrow at 10am.  Pt wanted to know if any of her medication could be causing her BP to be low as it was 88/49 at her dental visit today.  Pt also states she has had 16 oz of fluid today. Advised pt decreased fluid intake and diarrhea could also be contributing to her low BP.  Please advise re: medications.

## 2013-04-21 NOTE — Telephone Encounter (Signed)
Pt has follow up with [facility] on 05/05/13 and was last seen by [facility] on 04/15/13.  Please advise.       Kathi Simpers, CMA at 04/21/2013 8:14 AM    Status: Signed        Left detailed message on cell# to call and schedule appt for further evaluation of blood in stool.        Sandford Craze, NP at 04/20/2013 6:00 PM    Status: Signed        I was not aware re: rx for lomotil. I would recommend OV due to rectal bleeding.        Regis Bill, CMA at 04/20/2013 5:02 PM    Status: Signed        Patient states that there was suppose to be Rx to pharmacy for Lomotil, not there; c/o having diarrhea w/blood seen on toilet tissue/SLS  Please Advise/SLS

## 2013-04-21 NOTE — Telephone Encounter (Signed)
If patient is having diarrhea with rectal bleeding, she needs to be evaluated in office sooner than December.  I saw patient and diagnosed with UTI and H. Pylori infection.  Patient has old prescription for Lomotil for IBS with diarrhea but I do not remember discussing that Rx with patient giving she was not having diarrhea at last visit.

## 2013-04-22 ENCOUNTER — Ambulatory Visit: Payer: Self-pay | Admitting: Physician Assistant

## 2013-04-23 ENCOUNTER — Encounter: Payer: Self-pay | Admitting: Physician Assistant

## 2013-04-23 ENCOUNTER — Ambulatory Visit (INDEPENDENT_AMBULATORY_CARE_PROVIDER_SITE_OTHER): Payer: 59 | Admitting: Physician Assistant

## 2013-04-23 VITALS — BP 106/68 | HR 76 | Temp 98.3°F | Resp 16 | Ht 61.0 in | Wt 269.5 lb

## 2013-04-23 DIAGNOSIS — A048 Other specified bacterial intestinal infections: Secondary | ICD-10-CM

## 2013-04-23 DIAGNOSIS — Z23 Encounter for immunization: Secondary | ICD-10-CM

## 2013-04-23 DIAGNOSIS — K5900 Constipation, unspecified: Secondary | ICD-10-CM

## 2013-04-23 NOTE — Progress Notes (Signed)
Pre visit review using our clinic review tool, if applicable. No additional management support is needed unless otherwise documented below in the visit note/SLS  

## 2013-04-23 NOTE — Patient Instructions (Signed)
Please continue antibiotics as prescribed.  You can spread out the different medications meaning you don't have to take the amoxicillin and clarithromycin at the same times.  Keep good fluid intake.  Start soft foods as tolerated.  Try over-the-counter miralax to help with constipation.  You can take an over-the-counter ginger tablet to help with nausea as well.  I feel the nausea is coming from constipation and medicines.  Helping your bowels move will help with the nausea.  Follow-up with your PCP, Melissa if symptoms are not improving.  Polyethylene Glycol powder (Miralax) What is this medicine? POLYETHYLENE GLYCOL 3350 (pol ee ETH i leen; GLYE col) powder is a laxative used to treat constipation. It increases the amount of water in the stool. Bowel movements become easier and more frequent. This medicine may be used for other purposes; ask your health care provider or pharmacist if you have questions. COMMON BRAND NAME(S): GaviLax, GlycoLax, MiraLax, [facility] [facility]  What should I tell my health care provider before I take this medicine? They need to know if you have any of these conditions: -a history of blockage of the stomach or intestine -current abdomen distension or pain -difficulty swallowing -diverticulitis, ulcerative colitis, or other chronic bowel disease -phenylketonuria -an unusual or allergic reaction to polyethylene glycol, other medicines, dyes, or preservatives -pregnant or trying to get pregnant -breast-feeding How should I use this medicine? Take this medicine by mouth. The bottle has a measuring cap that is marked with a line. Pour the powder into the cap up to the marked line (the dose is about 1 heaping tablespoon). Add the powder in the cap to a full glass (4 to 8 ounces or 120 to 240 ml) of water, juice, soda, coffee or tea. Mix the powder well. Drink the solution. Take exactly as directed. Do not take your medicine more often than directed. Talk to your pediatrician  regarding the use of this medicine in children. Special care may be needed. Overdosage: If you think you have taken too much of this medicine contact a poison control center or emergency room at once. NOTE: This medicine is only for you. Do not share this medicine with others. What if I miss a dose? If you miss a dose, take it as soon as you can. If it is almost time for your next dose, take only that dose. Do not take double or extra doses. What may interact with this medicine? Interactions are not expected. This list may not describe all possible interactions. Give your health care provider a list of all the medicines, herbs, non-prescription drugs, or dietary supplements you use. Also tell them if you smoke, drink alcohol, or use illegal drugs. Some items may interact with your medicine. What should I watch for while using this medicine? Do not use for more than 2 weeks without advice from your doctor or health care professional. It can take 2 to 4 days to have a bowel movement and to experience improvement in constipation. See your health care professional for any changes in bowel habits, including constipation, that are severe or last longer than three weeks. Always take this medicine with plenty of water. What side effects may I notice from receiving this medicine? Side effects that you should report to your doctor or health care professional as soon as possible: -diarrhea -difficulty breathing -itching of the skin, hives, or skin rash -severe bloating, pain, or distension of the stomach -vomiting Side effects that usually do not require medical attention (report to your  doctor or health care professional if they continue or are bothersome): -bloating or gas -lower abdominal discomfort or cramps -nausea This list may not describe all possible side effects. Call your doctor for medical advice about side effects. You may report side effects to FDA at 1-800-FDA-1088. Where should I keep my  medicine? Keep out of the reach of children. Store between 15 and 30 degrees C (59 and 86 degrees F). Throw away any unused medicine after the expiration date. NOTE: This sheet is a summary. It may not cover all possible information. If you have questions about this medicine, talk to your doctor, pharmacist, or health care provider.  2014, Elsevier/Gold Standard. (2007-12-21 16:50:45)

## 2013-04-28 DIAGNOSIS — A048 Other specified bacterial intestinal infections: Secondary | ICD-10-CM | POA: Insufficient documentation

## 2013-04-28 DIAGNOSIS — K5900 Constipation, unspecified: Secondary | ICD-10-CM | POA: Insufficient documentation

## 2013-04-28 NOTE — Assessment & Plan Note (Signed)
Most likely contributing to nausea.  Increase fluid intake.  Fiber supplement and probiotic.  Stool softener.  Miralax if no BM in 2-3 days.  Ginger tablets to help with nausea.

## 2013-04-28 NOTE — Progress Notes (Signed)
Patient ID: Melanie Christian, female   DOB: [birth year], 27 y.o.   MRN: [id number]  Patient with recent diagnosis of H. Pylori infection on Triple Therapy presents to clinic complaining of nausea, constipation and one episode of bright red blood on toilet paper after a bowel movement. Patient endorses taking medication as prescribed but has felt slightly nauseas for the past couple of days.  Patient endorses abdominal bloating as well.  Denies fever, chills, muscle aches. Patient also endorses an episode of lightheadedness but notes she does not stay well-hydrated.  Patient had recent abdominal US and pelvic US with nor abnormal findings.  Recent labs were good except for positive H. Pylori and evidence of UTI.  Patient denies abdominal pain, vomiting.  Denies history of GI bleed.     Past Medical History  Diagnosis Date  . Anemia   . Depression   . GERD (gastroesophageal reflux disease)   . UTI (lower urinary tract infection)   . Obesities, morbid   . Migraines   . Bipolar 1 disorder   . Herpes     Current Outpatient Prescriptions on File Prior to Visit  Medication Sig Dispense Refill  . amoxicillin (AMOXIL) 500 MG tablet Take 2 tablets twice a day for 10 days.  40 tablet  0  . clarithromycin (BIAXIN) 500 MG tablet Take 1 tablet (500 mg total) by mouth 2 (two) times daily.  20 tablet  0  . Ibuprofen (ADVIL MIGRAINE PO) Take 2 tablets by mouth as needed. Migraine.      . lamoTRIgine (LAMICTAL) 100 MG tablet Take 200 mg by mouth every morning. Take 1 tablet every morning and 2 tablets at bedtime.      Marland Kitchen omeprazole (PRILOSEC) 20 MG capsule Take 1 capsule (20 mg total) by mouth 2 (two) times daily before a meal.  30 capsule  3  . propranolol (INDERAL) 40 MG tablet Take 1 tablet (40 mg total) by mouth 2 (two) times daily.  60 tablet  6  . SUMAtriptan (IMITREX) 100 MG tablet Take 1 tablet (100 mg total) by mouth once as needed for migraine. May repeat in 2 hours if headache persists or recurs.  10  tablet  6   No current facility-administered medications on file prior to visit.    Allergies  Allergen Reactions  . Hydrocodone-Acetaminophen     REACTION: Dizzy, "loopy"  . Oxycodone-Acetaminophen     REACTION: itching  . Topamax [Topiramate]   . Ultracet [Tramadol-Acetaminophen] Other (See Comments)    hyperactivity    Family History  Problem Relation Age of Onset  . Arthritis Mother   . Hyperlipidemia Mother   . Hypertension Mother   . Drug abuse Father   . Hypertension Father   . Depression Father     PTSD from [place]  . Polycystic ovary syndrome Sister   . Arthritis Maternal Grandmother   . Diabetes Maternal Grandmother   . Cancer Maternal Grandmother     stomach  . Hypertension Paternal Grandmother   . Sickle cell trait Other 30    History   Social History  . Marital Status: Single    Spouse Name: N/A    Number of Children: N/A  . Years of Education: N/A   Social History Main Topics  . Smoking status: Never Smoker   . Smokeless tobacco: Never Used  . Alcohol Use: 1.0 oz/week    2 drink(s) per week     Comment: Only drinks once a month when she goes to a  club. Has couple of drinks at a time.  . Drug Use: No     Comment: marijuana use  . Sexual Activity: Yes   Other Topics Concern  . None   Social History Narrative   Studying biology at [employer]   Married- female partner   Regular exercise:  Zumba every other day   Caffeine Use:  Tea/soda 1-2 times a week             Review of Systems - See HPI.  All other ROS are negative.   Filed Vitals:   04/23/13 0925  BP: 106/68  Pulse: 76  Temp: 98.3 F (36.8 C)  Resp: 16   Physical Exam  Vitals reviewed. Constitutional: She is oriented to person, place, and time and well-developed, well-nourished, and in no distress.  HENT:  Head: Normocephalic and atraumatic.  Eyes: Conjunctivae are normal.  Neck: Neck supple.  Cardiovascular: Normal rate, regular rhythm and normal heart sounds.    Pulmonary/Chest: Effort normal and breath sounds normal. No respiratory distress. She has no wheezes. She has no rales. She exhibits no tenderness.  Abdominal: Soft. Bowel sounds are normal. She exhibits no distension and no mass. There is no tenderness. There is no rebound and no guarding.  Lymphadenopathy:    She has no cervical adenopathy.  Neurological: She is alert and oriented to person, place, and time.  Skin: Skin is warm and dry. No rash noted.  Psychiatric: Affect normal.     Recent Results (from the past 2160 hour(s))  CBC WITH DIFFERENTIAL     Status: Abnormal   Collection Time    04/15/13 10:23 AM      Result Value Range   WBC 7.1  4.0 - 10.5 K/uL   RBC 4.85  3.87 - 5.11 MIL/uL   Hemoglobin 12.3  12.0 - 15.0 g/dL   HCT 16.1  09.6 - 04.5 %   MCV 78.4  78.0 - 100.0 fL   MCH 25.4 (*) 26.0 - 34.0 pg   MCHC 32.4  30.0 - 36.0 g/dL   RDW 40.9 (*) 81.1 - 91.4 %   Platelets 311  150 - 400 K/uL   Neutrophils Relative % 49  43 - 77 %   Neutro Abs 3.5  1.7 - 7.7 K/uL   Lymphocytes Relative 41  12 - 46 %   Lymphs Abs 2.9  0.7 - 4.0 K/uL   Monocytes Relative 7  3 - 12 %   Monocytes Absolute 0.5  0.1 - 1.0 K/uL   Eosinophils Relative 2  0 - 5 %   Eosinophils Absolute 0.2  0.0 - 0.7 K/uL   Basophils Relative 1  0 - 1 %   Basophils Absolute 0.0  0.0 - 0.1 K/uL   Smear Review Criteria for review not met    COMPREHENSIVE METABOLIC PANEL     Status: Abnormal   Collection Time    04/15/13 10:23 AM      Result Value Range   Sodium 137  135 - 145 mEq/L   Potassium 4.4  3.5 - 5.3 mEq/L   Chloride 103  96 - 112 mEq/L   CO2 27  19 - 32 mEq/L   Glucose, Bld 85  70 - 99 mg/dL   BUN 10  6 - 23 mg/dL   Creat 7.82  9.56 - 2.13 mg/dL   Total Bilirubin 0.2 (*) 0.3 - 1.2 mg/dL   Alkaline Phosphatase 98  39 - 117 U/L   AST 13  0 -  37 U/L   ALT 10  0 - 35 U/L   Total Protein 6.5  6.0 - 8.3 g/dL   Albumin 3.8  3.5 - 5.2 g/dL   Calcium 9.0  8.4 - 78.2 mg/dL  LIPASE     Status: None    Collection Time    04/15/13 10:23 AM      Result Value Range   Lipase 11  0 - 75 U/L  H. PYLORI ANTIBODY, IGG     Status: Abnormal   Collection Time    04/15/13 10:23 AM      Result Value Range   H Pylori IgG 1.46 (*)    Comment: IgG antibody to H. pylori detected suggestive of previous exposure or     active infection.              ISR = Immune Status Ratio                  <0.90         ISR       Negative                  0.90 - 1.09   ISR       Equivocal                  >=1.10        ISR       Positive           The above results were obtained with the Immulite 2000 H. pylori IgG     EIA.  Results obtained from other manufacturer's assay methods may not     be used interchangeably.        TSH     Status: None   Collection Time    04/15/13 10:23 AM      Result Value Range   TSH 3.437  0.350 - 4.500 uIU/mL  POCT URINE PREGNANCY     Status: None   Collection Time    04/15/13 10:41 AM      Result Value Range   Preg Test, Ur Negative    URINALYSIS, MICROSCOPIC ONLY     Status: Abnormal   Collection Time    04/15/13 10:41 AM      Result Value Range   Squamous Epithelial / LPF MANY (*) RARE   Crystals NONE SEEN  NONE SEEN   Casts NONE SEEN  NONE SEEN   WBC, UA 0-2  <3 WBC/hpf   RBC / HPF 0-2  <3 RBC/hpf   Bacteria, UA MANY (*) RARE  POCT URINALYSIS DIPSTICK     Status: None   Collection Time    04/15/13 10:42 AM      Result Value Range   Color, UA Light Yello     Clarity, UA Clear     Glucose, UA Negative     Bilirubin, UA Negative     Ketones, UA Negative     Spec Grav, UA 1.015     Blood, UA Trace     pH, UA 6.5     Protein, UA Trace     Urobilinogen, UA 0.2     Nitrite, UA Negative     Leukocytes, UA Negative      Assessment/Plan: H. pylori infection Continue medications as prescribed until all tablets are gone.  Nausea is most likely due to a mix of multiple antibiotics and constipation.  Probiotic.  Increase fluid intake.  Unspecified  constipation Most  likely contributing to nausea.  Increase fluid intake.  Fiber supplement and probiotic.  Stool softener.  Miralax if no BM in 2-3 days.  Ginger tablets to help with nausea.

## 2013-04-28 NOTE — Assessment & Plan Note (Signed)
Continue medications as prescribed until all tablets are gone.  Nausea is most likely due to a mix of multiple antibiotics and constipation.  Probiotic.  Increase fluid intake.

## 2013-05-05 ENCOUNTER — Ambulatory Visit: Payer: Self-pay | Admitting: Physician Assistant

## 2013-05-07 ENCOUNTER — Ambulatory Visit: Payer: Self-pay | Admitting: Physician Assistant

## 2013-05-12 ENCOUNTER — Ambulatory Visit: Payer: Self-pay | Admitting: Family

## 2013-05-12 DIAGNOSIS — Z0289 Encounter for other administrative examinations: Secondary | ICD-10-CM

## 2013-05-28 ENCOUNTER — Other Ambulatory Visit: Payer: Self-pay | Admitting: Family

## 2013-05-28 ENCOUNTER — Telehealth: Payer: Self-pay | Admitting: Family

## 2013-05-28 ENCOUNTER — Other Ambulatory Visit: Payer: Self-pay

## 2013-05-28 MED ORDER — VALACYCLOVIR HCL 500 MG PO TABS: 500.0000 mg | ORAL_TABLET | Freq: Two times a day (BID) | ORAL | Status: DC

## 2013-05-28 NOTE — Telephone Encounter (Signed)
Verbal per Dr Abner Greenspan ok to refill valtrex 500 mg quantity 6 tablets and no refills

## 2013-05-28 NOTE — Telephone Encounter (Signed)
THIS WAS DENIED.  SHE DID STOP TAKING IT AS MELISSA SAID SHE ONLY NEEDED THIS WHEN SHE HAS AN OUTBREAK.  SHE IS HAVING ONE AND NEEDS THE MEDS.  PLEASE CALL THE PATIENT @ [phone]  EXPLAINED TO THE PATIENT THAT MELISSA IS OFF AND CODY IS OFF AND SHE MIGHT HAVE TO BE SEEN PRIOR TO THIS MED BEING PRESCRIBED.  ASKED THAT I HAVE DR BLYTH'S NURSE CALL HER.

## 2013-05-28 NOTE — Telephone Encounter (Signed)
Requesting refill on valACYclovir (VALTREX) 1000 MG tablet

## 2013-05-28 NOTE — Telephone Encounter (Signed)
I left a detailed message on pts vm

## 2013-05-28 NOTE — Telephone Encounter (Signed)
This is done. Look at previous note

## 2013-06-15 ENCOUNTER — Telehealth: Payer: Self-pay | Admitting: Family

## 2013-06-15 DIAGNOSIS — R110 Nausea: Secondary | ICD-10-CM

## 2013-06-15 NOTE — Telephone Encounter (Signed)
Spoke with pt. She states she had an outbreak 1 week ago and feels like she may be getting ready to have another outbreak. Pt wants to know why this keeps happening and should she be taking a once daily dose of valtrex?  Pt also requests referral to GI to test for H.Pylori. Completed initial treatment. Continues to have intermittent nausea and constant burning of her stomach.  Please advise.

## 2013-06-15 NOTE — Telephone Encounter (Signed)
Valtrex refill.  She wants to take this every day.  Please call patient to discuss and send rx to [facility] on [address].  She wants a referral to Gi

## 2013-06-16 ENCOUNTER — Telehealth: Payer: Self-pay | Admitting: Family

## 2013-06-16 MED ORDER — VALACYCLOVIR HCL 1 G PO TABS: 1000.0000 mg | ORAL_TABLET | Freq: Every day | ORAL | Status: AC

## 2013-06-16 NOTE — Telephone Encounter (Signed)
Left message for pt to call with further clarification about her ?frequent illness? Requested pt to let us know what symptoms she is referring to and is this regarding our conversation yesterday?  If so, I left her a detailed message this morning.  Did she get my previous message?

## 2013-06-16 NOTE — Telephone Encounter (Signed)
Left detailed message on cell # and to call if further questions. 

## 2013-06-16 NOTE — Telephone Encounter (Signed)
Pt called back re: conversation yesterday. Reiterated recommendations from 06/15/13 phone note and pt voices understanding.

## 2013-06-16 NOTE — Telephone Encounter (Signed)
Rx sent for once daily valtrex for suppressive therapy. Common to have recurrent breakouts, hopefully this will decrease frequency of breakouts.  Placed referral for GI.

## 2013-06-16 NOTE — Telephone Encounter (Signed)
Patient called stating she is sick again.  Last time was about 2 weeks ago.  She wonders why she is getting sick so often

## 2013-06-24 ENCOUNTER — Ambulatory Visit: Payer: Self-pay | Admitting: Neurology

## 2013-06-25 ENCOUNTER — Ambulatory Visit (INDEPENDENT_AMBULATORY_CARE_PROVIDER_SITE_OTHER): Payer: 59 | Admitting: Physician Assistant

## 2013-06-25 ENCOUNTER — Encounter: Payer: Self-pay | Admitting: Physician Assistant

## 2013-06-25 ENCOUNTER — Telehealth: Payer: Self-pay | Admitting: Neurology

## 2013-06-25 VITALS — BP 108/62 | HR 76 | Ht 61.0 in | Wt 264.4 lb

## 2013-06-25 DIAGNOSIS — R1013 Epigastric pain: Secondary | ICD-10-CM

## 2013-06-25 DIAGNOSIS — K5909 Other constipation: Secondary | ICD-10-CM

## 2013-06-25 DIAGNOSIS — R635 Abnormal weight gain: Secondary | ICD-10-CM

## 2013-06-25 DIAGNOSIS — K5900 Constipation, unspecified: Secondary | ICD-10-CM

## 2013-06-25 MED ORDER — LINACLOTIDE 145 MCG PO CAPS: 145.0000 ug | ORAL_CAPSULE | Freq: Every day | ORAL | Status: DC

## 2013-06-25 MED ORDER — OMEPRAZOLE 40 MG PO CPDR: 40.0000 mg | DELAYED_RELEASE_CAPSULE | Freq: Every day | ORAL | Status: AC

## 2013-06-25 NOTE — Progress Notes (Signed)
Subjective:    Patient ID: Melanie Christian, female    DOB: 01/22/1986, 28 y.o.   MRN: [id number]  HPI  Melanie Christian is a pleasant 28 year old African American female referred today by primary care/[facility] for evaluation of complaints of epigastric pain. She is known remotely to Melanie Christian. She had been seen in November of 2014 by primary care and had multiple labs done all of which were unremarkable. Her H. pylori antibody however was positive and she was treated with a course of pylera She also had upper abdominal ultrasound and pelvic ultrasound done both were negative. She comes in today because she says she has had persistent problems with epigastric pain and burning over the past 3-4 months. She did not really notice any improvement with treatment for the H. pylori. She does have Prilosec on her list of meds but has not been taking this regularly. She says everything she eats or drinks causes burning in her stomach doesn't seem to matter what she eats. Her appetite has been fine. She does have some intermittent nausea. She denies any regular aspirin or NSAID use. She also states that this past weekend she had a sharp rectal pain and some lower abdominal discomfort which lasted for couple of hours and then resolved. She had taken some Gas-X wasn't sure whether or not this made a difference but says the symptoms have not recurred but worried her. Has not noted any melena or hematochezia. She does state that she has family history of stomach cancer in her grandmother and has a great uncle with colon cancer mother with colon polyps. Patient says generally she has problems with constipation and will go 3-4 days without a bowel movement she's not on any regular stool softeners or laxatives. Occasionally she has episodes of urgency and through her life has had a few episodes of fecal incontinence associated with urgency as well. She had one episode of some seepage of stool recently. She also mentions  that she has lactose intolerance.  Review of Systems  Constitutional: Negative.   HENT: Negative.   Eyes: Negative.   Respiratory: Negative.   Cardiovascular: Negative.   Gastrointestinal: Positive for abdominal pain and constipation.  Endocrine: Negative.   Genitourinary: Negative.   Musculoskeletal: Negative.   Allergic/Immunologic: Negative.   Neurological: Negative.   Hematological: Negative.   Psychiatric/Behavioral: Negative.    Outpatient Prescriptions Prior to Visit  Medication Sig Dispense Refill  . FIBER COMPLETE TABS Take 2 each by mouth daily.      . Ibuprofen (ADVIL MIGRAINE PO) Take 2 tablets by mouth as needed. Migraine.      . lamoTRIgine (LAMICTAL) 100 MG tablet Take 200 mg by mouth every morning. Take 1 tablet every morning and 2 tablets at bedtime.      . SUMAtriptan (IMITREX) 100 MG tablet Take 1 tablet (100 mg total) by mouth once as needed for migraine. May repeat in 2 hours if headache persists or recurs.  10 tablet  6  . valACYclovir (VALTREX) 1000 MG tablet Take 1 tablet (1,000 mg total) by mouth daily.  30 tablet  5  . omeprazole (PRILOSEC) 20 MG capsule Take 1 capsule (20 mg total) by mouth 2 (two) times daily before a meal.  30 capsule  3  . amoxicillin (AMOXIL) 500 MG tablet Take 2 tablets twice a day for 10 days.  40 tablet  0  . clarithromycin (BIAXIN) 500 MG tablet Take 1 tablet (500 mg total) by mouth 2 (two) times  daily.  20 tablet  0  . ibuprofen (ADVIL,MOTRIN) 800 MG tablet Take 800 mg by mouth every 6 (six) hours as needed.      . propranolol (INDERAL) 40 MG tablet Take 1 tablet (40 mg total) by mouth 2 (two) times daily.  60 tablet  6   No facility-administered medications prior to visit.   Allergies  Allergen Reactions  . Hydrocodone-Acetaminophen     REACTION: Dizzy, "loopy"  . Oxycodone-Acetaminophen     REACTION: itching  . Topamax [Topiramate]   . Ultracet [Tramadol-Acetaminophen] Other (See Comments)    hyperactivity   Patient  Active Problem List   Diagnosis Date Noted  . H. pylori infection 04/28/2013  . Unspecified constipation 04/28/2013  . Bilateral lower abdominal pain 04/15/2013  . Migraine with aura and without status migrainosus, not intractable 03/26/2013  . Diarrhea 09/09/2012  . Vaginitis 09/09/2012  . Routine general medical examination at a health care facility 12/31/2011  . Amenorrhea 05/10/2011  . 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 General medical examination 09/25/2010  . COMMON MIGRAINE 08/30/2009  . GERD 08/30/2009  . IRRITABLE BOWEL SYNDROME 08/30/2009  . Bipolar disorder 08/16/2009   History  Substance Use Topics  . Smoking status: Never Smoker   . Smokeless tobacco: Never Used  . Alcohol Use: 1.0 oz/week    2 drink(s) per week     Comment: Only drinks once a month when she goes to a club. Has couple of drinks at a time.   family history includes Arthritis in her maternal grandmother and mother; Colon polyps in her maternal grandfather and maternal uncle; Depression in her father; Diabetes in her maternal grandmother; Drug abuse in her father; Fibromyalgia in her mother; Hyperlipidemia in her mother; Hypertension in her father, mother, and paternal grandmother; Polycystic ovary syndrome in her sister; Sickle cell trait (age of onset: 5830) in her other; Stomach cancer in her maternal grandmother. There is no history of Colon cancer.     Objective:   Physical Exam  well-developed obese young [place] female in no acute distress, pleasant blood pressure 108/62 pulse 76 height 5 foot 1 weight 264. HEENT; nontraumatic normocephalic EOMI PERRLA sclera anicteric, Supple; no JVD, Cardiovascular; regular rate and rhythm with S1-S2 no murmur or gallop, Pulmonary ;clear bilaterally, Abdomen; soft minimally tender in the epigastrium there is no guarding or rebound no palpable mass or hepatosplenomegaly she also has some mild left lower quadrant tenderness, Rectal ;exam normal  sphincter tone stool brown and heme-negative, Extremities; no clubbing cyanosis or edema skin warm and dry, Psych; mood and affect appropriate        Assessment & Plan:  #561  28 year old female with persistent epigastric burning. Patient is status post treatment for H. pylori positive antibody November 2014 with no improvement in symptoms. Rule out gastritis or peptic ulcer disease versus functional dyspepsia # 2 chronic constipation #3 probable underlying IBS with occasional episodes of urgency #4 lactose intolerance #5 morbid obesity #6 migraines  Plan; start Prilosec 40 mg by mouth every morning Schedule for EGD with Dr. Kathlee NationsJacobs-procedure discussed in detail with the patient and she is agreeable to proceed We discussed the option of treating her empirically initially but she prefers to proceed with endoscopic workup Start trial of Linzess145 mcg by mouth every morning-samples given today and if this is helpful we will send a prescription

## 2013-06-25 NOTE — Patient Instructions (Signed)
You have been scheduled for an endoscopy with propofol. Please follow written instructions given to you at your visit today. If you use inhalers (even only as needed), please bring them with you on the day of your procedure. Your physician has requested that you go to www.startemmi.com and enter the access code given to you at your visit today. This web site gives a general overview about your procedure. However, you should still follow specific instructions given to you by our office regarding your preparation for the procedure.  We have sent the following medications to your pharmacy for you to pick up at your convenience: Prilosec 40 mg every morning Linzess 145 mcg once daily before a meal  We have given you samples of the following medication to take: Linzess

## 2013-06-25 NOTE — Progress Notes (Signed)
i agree with the plan outlined in this note. EGD for her epigastric pains

## 2013-06-25 NOTE — Telephone Encounter (Signed)
Pt no showed 06/24/13 follow up appt. No show letter mailed to pt / Sherri S.

## 2013-06-28 ENCOUNTER — Encounter: Payer: Self-pay | Admitting: Gastroenterology

## 2013-06-30 ENCOUNTER — Telehealth: Payer: Self-pay | Admitting: Gastroenterology

## 2013-06-30 ENCOUNTER — Ambulatory Visit (AMBULATORY_SURGERY_CENTER): Payer: 59 | Admitting: Gastroenterology

## 2013-06-30 ENCOUNTER — Encounter: Payer: Self-pay | Admitting: Gastroenterology

## 2013-06-30 VITALS — BP 118/82 | HR 63 | Temp 97.2°F | Resp 12 | Ht 61.0 in | Wt 264.0 lb

## 2013-06-30 DIAGNOSIS — R1013 Epigastric pain: Secondary | ICD-10-CM

## 2013-06-30 DIAGNOSIS — K2970 Gastritis, unspecified, without bleeding: Secondary | ICD-10-CM

## 2013-06-30 DIAGNOSIS — K2990 Gastroduodenitis, unspecified, without bleeding: Secondary | ICD-10-CM

## 2013-06-30 MED ORDER — SODIUM CHLORIDE 0.9 % IV SOLN: 500.0000 mL | INTRAVENOUS | Status: DC

## 2013-06-30 NOTE — Progress Notes (Signed)
Called to room to assist during endoscopic procedure.  Patient ID and intended procedure confirmed with present staff. Received instructions for my participation in the procedure from the performing physician.  

## 2013-06-30 NOTE — Op Note (Signed)
[facility] [address] [place], [id number]   ENDOSCOPY PROCEDURE REPORT  PATIENT: Melanie Christian, Melanie E.  MR#: [id number] BIRTHDATE: December 14, 1985 , 27  yrs. old GENDER: Female ENDOSCOPIST: Rachael Feeaniel P Jacobs, MD PROCEDURE DATE:  06/30/2013 PROCEDURE:  EGD w/ biopsy ASA CLASS:     Class II INDICATIONS:  epigastric pain, recent H.  pylori + by serology (treated). MEDICATIONS: MAC sedation, administered by CRNA and propofol (Diprivan) 250mg  IV TOPICAL ANESTHETIC: none  DESCRIPTION OF PROCEDURE: After the risks benefits and alternatives of the procedure were thoroughly explained, informed consent was obtained.  The LB [id number] endoscope was introduced through the mouth and advanced to the second portion of the duodenum. Without limitations.  The instrument was slowly withdrawn as the mucosa was fully examined.    The upper, middle and distal third of the esophagus were carefully inspected and no abnormalities were noted.  The z-line was well seen at the GEJ.  The endoscope was pushed into the fundus which was normal including a retroflexed view.  The antrum, gastric body, first and second part of the duodenum were unremarkable. The distal stomach was biopsied and sent to pathology.  Retroflexed views revealed no abnormalities.     The scope was then withdrawn from the patient and the procedure completed.  COMPLICATIONS: There were no complications. ENDOSCOPIC IMPRESSION: Normal EGD, distal stomach biosied to check for residual, recurrent H. pylori.  RECOMMENDATIONS: Please start the once daily antiacid medicine that was recommended in office recently.  OTC prilosec or prevacid once daily would likely work well as well.  Your BMI is 49-50 which is in the morbidly obese category.  Losing weight will likely help your GI symptoms signficantly.  Please call to report on your constipation and abdominal pains in 3-4 weeks.  If biopsies show persistent  H. pylori, appropriate antibiotics will be started.   eSigned:  Rachael Feeaniel P Jacobs, MD 06/30/2013 9:55 AM   CC: [name]

## 2013-06-30 NOTE — Telephone Encounter (Signed)
On call @ 1945. She developed pain between shoulder blades and trouble taking a deep breath following EGD today, without dilation or other therapeutic manuever. She does not note increased gas, fever, abd pain, N/V. Advised to go to ED or Urgent Care for evaluation tonight. This does not appear to be procedure related.

## 2013-06-30 NOTE — Progress Notes (Signed)
Lidocaine-40mg IV prior to Propofol InductionPropofol given over incremental dosages 

## 2013-06-30 NOTE — Patient Instructions (Signed)
Discharge instructions given with verbal understanding. Biopsies taken. Resume previous medications. YOU HAD AN ENDOSCOPIC PROCEDURE TODAY AT THE [facility]: Refer to the procedure report that was given to you for any specific questions about what was found during the examination.  If the procedure report does not answer your questions, please call your gastroenterologist to clarify.  If you requested that your care partner not be given the details of your procedure findings, then the procedure report has been included in a sealed envelope for you to review at your convenience later.  YOU SHOULD EXPECT: Some feelings of bloating in the abdomen. Passage of more gas than usual.  Walking can help get rid of the air that was put into your GI tract during the procedure and reduce the bloating. If you had a lower endoscopy (such as a colonoscopy or flexible sigmoidoscopy) you may notice spotting of blood in your stool or on the toilet paper. If you underwent a bowel prep for your procedure, then you may not have a normal bowel movement for a few days.  DIET: Your first meal following the procedure should be a light meal and then it is ok to progress to your normal diet.  A half-sandwich or bowl of soup is an example of a good first meal.  Heavy or fried foods are harder to digest and may make you feel nauseous or bloated.  Likewise meals heavy in dairy and vegetables can cause extra gas to form and this can also increase the bloating.  Drink plenty of fluids but you should avoid alcoholic beverages for 24 hours.  ACTIVITY: Your care partner should take you home directly after the procedure.  You should plan to take it easy, moving slowly for the rest of the day.  You can resume normal activity the day after the procedure however you should NOT DRIVE or use heavy machinery for 24 hours (because of the sedation medicines used during the test).    SYMPTOMS TO REPORT IMMEDIATELY: A gastroenterologist  can be reached at any hour.  During normal business hours, 8:30 AM to 5:00 PM Monday through Friday, call [phone].  After hours and on weekends, please call the GI answering service at [phone] who will take a message and have the physician on call contact you.   Following upper endoscopy (EGD)  Vomiting of blood or coffee ground material  New chest pain or pain under the shoulder blades  Painful or persistently difficult swallowing  New shortness of breath  Fever of 100F or higher  Black, tarry-looking stools  FOLLOW UP: If any biopsies were taken you will be contacted by phone or by letter within the next 1-3 weeks.  Call your gastroenterologist if you have not heard about the biopsies in 3 weeks.  Our staff will call the home number listed on your records the next business day following your procedure to check on you and address any questions or concerns that you may have at that time regarding the information given to you following your procedure. This is a courtesy call and so if there is no answer at the home number and we have not heard from you through the emergency physician on call, we will assume that you have returned to your regular daily activities without incident.  SIGNATURES/CONFIDENTIALITY: You and/or your care partner have signed paperwork which will be entered into your electronic medical record.  These signatures attest to the fact that that the information above on your After   Visit Summary has been reviewed and is understood.  Full responsibility of the confidentiality of this discharge information lies with you and/or your care-partner. 

## 2013-07-01 ENCOUNTER — Telehealth: Payer: Self-pay | Admitting: *Deleted

## 2013-07-01 NOTE — Telephone Encounter (Signed)
Patty, Can you call her, see how she is feeling today.

## 2013-07-01 NOTE — Telephone Encounter (Signed)
Lm on vm of number given in admitting that identifies pt by first and last name to return call if problems or concerns. emw

## 2013-07-02 ENCOUNTER — Ambulatory Visit: Payer: Self-pay | Admitting: Gastroenterology

## 2013-07-02 NOTE — Telephone Encounter (Signed)
Pt did not see ER or urgent care.  She has appt with Melanie Christian today.

## 2013-07-02 NOTE — Telephone Encounter (Signed)
Left message on machine to call back  

## 2013-07-02 NOTE — Telephone Encounter (Signed)
Pt is continuing to have pain when she swallows, she can breathe without pain now.  Ibuprofen helps the pain.  Also, pt was advised that her insurance will not cover omeprazole until she tries OTC first. Pt agrees.  I will forward to Dr Christella HartiganJacobs for further review.

## 2013-07-02 NOTE — Telephone Encounter (Signed)
She needs rov today with extender or MD of the day or me (i am already double booked this afternoon).    It is ok for her to try OTC omeprazole.  Did she ever go to ER or urgent as recommend a couple nights ago?

## 2013-07-08 ENCOUNTER — Encounter: Payer: Self-pay | Admitting: Gastroenterology

## 2013-08-13 ENCOUNTER — Other Ambulatory Visit: Payer: Self-pay | Admitting: Family

## 2013-08-13 ENCOUNTER — Ambulatory Visit (INDEPENDENT_AMBULATORY_CARE_PROVIDER_SITE_OTHER): Payer: 59 | Admitting: Family

## 2013-08-13 ENCOUNTER — Encounter: Payer: Self-pay | Admitting: Family

## 2013-08-13 VITALS — BP 100/70 | HR 82 | Temp 98.0°F | Resp 16 | Ht 62.75 in | Wt 266.0 lb

## 2013-08-13 DIAGNOSIS — R21 Rash and other nonspecific skin eruption: Secondary | ICD-10-CM | POA: Insufficient documentation

## 2013-08-13 DIAGNOSIS — L708 Other acne: Secondary | ICD-10-CM

## 2013-08-13 DIAGNOSIS — L680 Hirsutism: Secondary | ICD-10-CM

## 2013-08-13 DIAGNOSIS — L709 Acne, unspecified: Secondary | ICD-10-CM | POA: Insufficient documentation

## 2013-08-13 LAB — HEMOGLOBIN A1C
Hgb A1c MFr Bld: 5.5 % (ref <5.7)
MEAN PLASMA GLUCOSE: 111 mg/dL (ref <117)

## 2013-08-13 MED ORDER — BENZOYL PEROXIDE-ERYTHROMYCIN 5-3 % EX GEL: Freq: Two times a day (BID) | CUTANEOUS | Status: DC

## 2013-08-13 NOTE — Progress Notes (Signed)
Subjective:    Patient ID: Melanie Christian, female    DOB: 11/08/1985, 28 y.o.   MRN: [id number]  HPI  Melanie Christian is a 28 yr old female who presents today with chief complaint of rash. Rash is located on face/lips and has been present for about 2 months. Reports lips burn and itch.  Reports dry skin.  Reports that symptoms were improving for a while.  She uses coconut oil, shea butter or vaseline.  Worsened after she started a new lipstick.    She reports that she gets breakouts on her forehead and her cheeks.  Has never had acne.  She reports + dysmenorrhea.  Using ibuprofen 800 prn.  Has had neavy periods.  Reports + facial hair.   Reports weight is fluctuating.  Reports that she has had increased appetite.  Wt Readings from Last 3 Encounters:  08/13/13 266 lb 0.6 oz (120.675 kg)  06/30/13 264 lb (119.75 kg)  06/25/13 264 lb 6.4 oz (119.931 kg)      Review of Systems See HPI  Past Medical History  Diagnosis Date  . Anemia   . Depression   . GERD (gastroesophageal reflux disease)   . UTI (lower urinary tract infection)   . Obesities, morbid   . Migraines   . Bipolar 1 disorder   . Herpes   . Helicobacter pylori antibody positive     History   Social History  . Marital Status: Single    Spouse Name: N/A    Number of Children: N/A  . Years of Education: N/A   Occupational History  . Not on file.   Social History Main Topics  . Smoking status: Never Smoker   . Smokeless tobacco: Never Used  . Alcohol Use: 1.0 oz/week    2 drink(s) per week     Comment: Only drinks once a month when she goes to a club. Has couple of drinks at a time.  . Drug Use: No     Comment: marijuana use  . Sexual Activity: Yes   Other Topics Concern  . Not on file   Social History Narrative   Studying biology at [employer]   Married- female partner   Regular exercise:  Zumba every other day   Caffeine Use:  Tea/soda 1-2 times a week             Past Surgical History  Procedure  Laterality Date  . Tonsillectomy  1996  . Cesarean section  04/2008  . Dilation and curettage of uterus  01/2007  . Induced abortion  11/22/09    elective (patient prefers nobody know this!- please do not call out as part of medical history!)  . Breast reduction surgery  12/13    Family History  Problem Relation Age of Onset  . Arthritis Mother   . Hyperlipidemia Mother   . Hypertension Mother   . Drug abuse Father   . Hypertension Father   . Depression Father     PTSD from [place]  . Polycystic ovary syndrome Sister   . Arthritis Maternal Grandmother   . Diabetes Maternal Grandmother   . Stomach cancer Maternal Grandmother   . Hypertension Paternal Grandmother   . Sickle cell trait Other 30  . Fibromyalgia Mother   . Colon cancer Neg Hx   . Colon polyps Maternal Uncle   . Colon polyps Maternal Grandfather     Allergies  Allergen Reactions  . Aspirin Nausea Only  . Hydrocodone-Acetaminophen  REACTION: Dizzy, "loopy"  . Oxycodone-Acetaminophen     REACTION: itching  . Topamax [Topiramate]   . Ultracet [Tramadol-Acetaminophen] Other (See Comments)    hyperactivity    Current Outpatient Prescriptions on File Prior to Visit  Medication Sig Dispense Refill  . Ibuprofen (ADVIL MIGRAINE PO) Take 2 tablets by mouth as needed. Migraine.      . lamoTRIgine (LAMICTAL) 100 MG tablet Take 200 mg by mouth every morning. Take 1 tablet every morning and 2 tablets at bedtime.      Marland Kitchen. omeprazole (PRILOSEC) 40 MG capsule Take 1 capsule (40 mg total) by mouth daily.  30 capsule  1  . SUMAtriptan (IMITREX) 100 MG tablet Take 1 tablet (100 mg total) by mouth once as needed for migraine. May repeat in 2 hours if headache persists or recurs.  10 tablet  6  . valACYclovir (VALTREX) 1000 MG tablet Take 1 tablet (1,000 mg total) by mouth daily.  30 tablet  5   No current facility-administered medications on file prior to visit.    BP 100/70  Pulse 82  Temp(Src) 98 F (36.7 C) (Oral)   Resp 16  Ht 5' 2.75" (1.594 m)  Wt 266 lb 0.6 oz (120.675 kg)  BMI 47.49 kg/m2  SpO2 99%  LMP 07/30/2013       Objective:   Physical Exam  Constitutional: She is oriented to person, place, and time. She appears well-developed and well-nourished. No distress.  Cardiovascular: Normal rate and regular rhythm.   No murmur heard. Pulmonary/Chest: Effort normal and breath sounds normal. No respiratory distress. She has no wheezes. She has no rales. She exhibits no tenderness.  Musculoskeletal: She exhibits no edema.  Neurological: She is alert and oriented to person, place, and time.  Skin:  + facial acne, no rash or lip lesions noted          Assessment & Plan:

## 2013-08-13 NOTE — Progress Notes (Signed)
Pre visit review using our clinic review tool, if applicable. No additional management support is needed unless otherwise documented below in the visit note. 

## 2013-08-13 NOTE — Assessment & Plan Note (Addendum)
New. Trial of benzamycin gel. I am concerned for PCOS. Will order Pelvic US and hormonal studies to further evaluate.

## 2013-08-13 NOTE — Patient Instructions (Signed)
Please complete your lab work prior to leaving. Schedule ultrasound on the first floor. Start benzamycin gel for acne. Follow up in 2 months.

## 2013-08-14 LAB — CORTISOL: CORTISOL PLASMA: 7.9 ug/dL

## 2013-08-14 LAB — PROLACTIN: Prolactin: 7 ng/mL

## 2013-08-14 LAB — TSH: TSH: 2.536 u[IU]/mL (ref 0.350–4.500)

## 2013-08-16 ENCOUNTER — Ambulatory Visit (HOSPITAL_BASED_OUTPATIENT_CLINIC_OR_DEPARTMENT_OTHER)
Admission: RE | Admit: 2013-08-16 | Discharge: 2013-08-16 | Disposition: A | Discharge status: Home / Self Care | Payer: 59 | Source: Ambulatory Visit | Attending: Family | Admitting: Family

## 2013-08-16 DIAGNOSIS — L680 Hirsutism: Secondary | ICD-10-CM

## 2013-08-16 LAB — TESTOSTERONE, FREE, TOTAL, SHBG
SEX HORMONE BINDING: 45 nmol/L (ref 18–114)
Testosterone, Free: 10.9 pg/mL — ABNORMAL HIGH (ref 0.6–6.8)
Testosterone-% Free: 1.5 % (ref 0.4–2.4)
Testosterone: 73 ng/dL — ABNORMAL HIGH (ref 10–70)

## 2013-08-16 LAB — DHEA-SULFATE: DHEA SO4: 177 ug/dL (ref 35–430)

## 2013-08-17 ENCOUNTER — Other Ambulatory Visit: Payer: Self-pay | Admitting: Family

## 2013-08-17 DIAGNOSIS — E288 Other ovarian dysfunction: Secondary | ICD-10-CM | POA: Insufficient documentation

## 2013-08-17 NOTE — Telephone Encounter (Signed)
Testosterone level is elevated.  Likely cause for facial hair and acne. US negative for polycystic ovaries.  Sugar is normal.  I would recommend OCP to help with testosterone, facial hair, acne.  I have pended below. Recommend she follow up in 3 months.

## 2013-08-18 MED ORDER — NORETHINDRONE ACET-ETHINYL EST 1.5-30 MG-MCG PO TABS: 1.0000 | ORAL_TABLET | Freq: Every day | ORAL | Status: DC

## 2013-08-18 NOTE — Telephone Encounter (Signed)
Notified pt and she voices understanding. Rx sent. Follow up scheduled for 11/22/13 at 1:45pm.

## 2013-08-25 ENCOUNTER — Ambulatory Visit (INDEPENDENT_AMBULATORY_CARE_PROVIDER_SITE_OTHER): Payer: 59 | Admitting: Family

## 2013-08-25 ENCOUNTER — Encounter: Payer: Self-pay | Admitting: Family

## 2013-08-25 ENCOUNTER — Other Ambulatory Visit (HOSPITAL_COMMUNITY)
Admission: RE | Admit: 2013-08-25 | Discharge: 2013-08-25 | Disposition: A | Discharge status: Home / Self Care | Payer: 59 | Source: Ambulatory Visit | Attending: Family | Admitting: Family

## 2013-08-25 VITALS — BP 130/88 | HR 75 | Temp 98.2°F | Ht 62.5 in | Wt 267.0 lb

## 2013-08-25 DIAGNOSIS — N760 Acute vaginitis: Secondary | ICD-10-CM | POA: Insufficient documentation

## 2013-08-25 DIAGNOSIS — R209 Unspecified disturbances of skin sensation: Secondary | ICD-10-CM

## 2013-08-25 DIAGNOSIS — N898 Other specified noninflammatory disorders of vagina: Secondary | ICD-10-CM

## 2013-08-25 DIAGNOSIS — Z113 Encounter for screening for infections with a predominantly sexual mode of transmission: Secondary | ICD-10-CM | POA: Insufficient documentation

## 2013-08-25 DIAGNOSIS — R200 Anesthesia of skin: Secondary | ICD-10-CM

## 2013-08-25 DIAGNOSIS — Z0000 Encounter for general adult medical examination without abnormal findings: Secondary | ICD-10-CM

## 2013-08-25 LAB — CBC WITH DIFFERENTIAL/PLATELET
BASOS ABS: 0.1 10*3/uL (ref 0.0–0.1)
BASOS PCT: 1 % (ref 0–1)
EOS PCT: 2 % (ref 0–5)
Eosinophils Absolute: 0.1 10*3/uL (ref 0.0–0.7)
HCT: 36.6 % (ref 36.0–46.0)
Hemoglobin: 12 g/dL (ref 12.0–15.0)
Lymphocytes Relative: 38 % (ref 12–46)
Lymphs Abs: 2.7 10*3/uL (ref 0.7–4.0)
MCH: 25.6 pg — ABNORMAL LOW (ref 26.0–34.0)
MCHC: 32.8 g/dL (ref 30.0–36.0)
MCV: 78 fL (ref 78.0–100.0)
Monocytes Absolute: 0.6 10*3/uL (ref 0.1–1.0)
Monocytes Relative: 8 % (ref 3–12)
NEUTROS ABS: 3.6 10*3/uL (ref 1.7–7.7)
Neutrophils Relative %: 51 % (ref 43–77)
PLATELETS: 299 10*3/uL (ref 150–400)
RBC: 4.69 MIL/uL (ref 3.87–5.11)
RDW: 16.6 % — AB (ref 11.5–15.5)
WBC: 7 10*3/uL (ref 4.0–10.5)

## 2013-08-25 LAB — BASIC METABOLIC PANEL WITH GFR
BUN: 9 mg/dL (ref 6–23)
CALCIUM: 9 mg/dL (ref 8.4–10.5)
CO2: 27 mEq/L (ref 19–32)
Chloride: 104 mEq/L (ref 96–112)
Creat: 0.5 mg/dL (ref 0.50–1.10)
GFR, Est Non African American: >89 mL/min
Glucose, Bld: 82 mg/dL (ref 70–99)
POTASSIUM: 4.4 meq/L (ref 3.5–5.3)
SODIUM: 139 meq/L (ref 135–145)

## 2013-08-25 LAB — HEPATIC FUNCTION PANEL
ALT: 8 U/L (ref 0–35)
AST: 11 U/L (ref 0–37)
Albumin: 3.8 g/dL (ref 3.5–5.2)
Alkaline Phosphatase: 111 U/L (ref 39–117)
BILIRUBIN DIRECT: 0.1 mg/dL (ref 0.0–0.3)
Indirect Bilirubin: 0.2 mg/dL (ref 0.2–1.2)
TOTAL PROTEIN: 6.6 g/dL (ref 6.0–8.3)
Total Bilirubin: 0.3 mg/dL (ref 0.2–1.2)

## 2013-08-25 LAB — LIPID PANEL
CHOLESTEROL: 142 mg/dL (ref 0–200)
HDL: 49 mg/dL (ref >39)
LDL CALC: 81 mg/dL (ref 0–99)
TRIGLYCERIDES: 61 mg/dL (ref <150)
Total CHOL/HDL Ratio: 2.9 Ratio
VLDL: 12 mg/dL (ref 0–40)

## 2013-08-25 NOTE — Patient Instructions (Signed)
Complete your lab work prior to leaving. Try keeping track of your calories and exercise using my fitness pal. Follow up in 6 months, sooner if problems/concerns.

## 2013-08-25 NOTE — Progress Notes (Signed)
Pre visit review using our clinic review tool, if applicable. No additional management support is needed unless otherwise documented below in the visit note. 

## 2013-08-25 NOTE — Progress Notes (Signed)
Subjective:    Patient ID: Melanie Christian, female    DOB: 07/02/1985, 28 y.o.   MRN: [id number]  HPI  [name] is a 28 yr old female who presents today for cpx. Immunizations: Diet: eats too much candy Exercise: dances once a week.   Pap: due  Wt Readings from Last 3 Encounters:  08/25/13 267 lb 0.6 oz (121.129 kg)  08/13/13 266 lb 0.6 oz (120.675 kg)  06/30/13 264 lb (119.75 kg)     Review of Systems  Constitutional: Negative for unexpected weight change.  HENT: Negative for postnasal drip.   Respiratory: Negative for cough and shortness of breath.   Cardiovascular: Negative for chest pain.  Gastrointestinal: Negative for nausea, vomiting and diarrhea.  Genitourinary: Negative for menstrual problem.  Musculoskeletal: Negative for myalgias.       Occasional knee/hip pain  Neurological:       Notes occasional "nerve pain" in arm or leg.  Relieves with ibuprofen. Denies falls.  Denies visual changes not associated with migraines. Notes random numbness.     Past Medical History  Diagnosis Date  . Anemia   . Depression   . GERD (gastroesophageal reflux disease)   . UTI (lower urinary tract infection)   . Obesities, morbid   . Migraines   . Bipolar 1 disorder   . Herpes   . Helicobacter pylori antibody positive     History   Social History  . Marital Status: Single    Spouse Name: N/A    Number of Children: N/A  . Years of Education: N/A   Occupational History  . Not on file.   Social History Main Topics  . Smoking status: Never Smoker   . Smokeless tobacco: Never Used  . Alcohol Use: 1.0 oz/week    2 drink(s) per week     Comment: Only drinks once a month when she goes to a club. Has couple of drinks at a time.  . Drug Use: No     Comment: marijuana use  . Sexual Activity: Yes   Other Topics Concern  . Not on file   Social History Narrative   Studying biology at [employer]   Married- female partner   Regular exercise:  Zumba every other day   Caffeine  Use:  Tea/soda 1-2 times a week             Past Surgical History  Procedure Laterality Date  . Tonsillectomy  1996  . Cesarean section  04/2008  . Dilation and curettage of uterus  01/2007  . Induced abortion  11/22/09    elective (patient prefers nobody know this!- please do not call out as part of medical history!)  . Breast reduction surgery  12/13    Family History  Problem Relation Age of Onset  . Arthritis Mother   . Hyperlipidemia Mother   . Hypertension Mother   . Drug abuse Father   . Hypertension Father   . Depression Father     PTSD from [place]  . Polycystic ovary syndrome Sister   . Arthritis Maternal Grandmother   . Diabetes Maternal Grandmother   . Stomach cancer Maternal Grandmother   . Hypertension Paternal Grandmother   . Sickle cell trait Other 30  . Fibromyalgia Mother   . Colon cancer Neg Hx   . Colon polyps Maternal Uncle   . Colon polyps Maternal Grandfather   . Multiple sclerosis  21    Allergies  Allergen Reactions  . Aspirin Nausea Only  .  Hydrocodone-Acetaminophen     REACTION: Dizzy, "loopy"  . Oxycodone-Acetaminophen     REACTION: itching  . Topamax [Topiramate]   . Ultracet [Tramadol-Acetaminophen] Other (See Comments)    hyperactivity    Current Outpatient Prescriptions on File Prior to Visit  Medication Sig Dispense Refill  . benzoyl peroxide-erythromycin (BENZAMYCIN) gel Apply topically 2 (two) times daily.  23.3 g  2  . Ibuprofen (ADVIL MIGRAINE PO) Take 2 tablets by mouth as needed. Migraine.      . lamoTRIgine (LAMICTAL) 100 MG tablet Take 200 mg by mouth every morning. Take 1 tablet every morning and 2 tablets at bedtime.      . Norethindrone Acetate-Ethinyl Estradiol (JUNEL,LOESTRIN,MICROGESTIN) 1.5-30 MG-MCG tablet Take 1 tablet by mouth daily.  1 Package  5  . omeprazole (PRILOSEC) 40 MG capsule Take 1 capsule (40 mg total) by mouth daily.  30 capsule  1  . SUMAtriptan (IMITREX) 100 MG tablet Take 1 tablet (100 mg total)  by mouth once as needed for migraine. May repeat in 2 hours if headache persists or recurs.  10 tablet  6  . valACYclovir (VALTREX) 1000 MG tablet Take 1 tablet (1,000 mg total) by mouth daily.  30 tablet  5   No current facility-administered medications on file prior to visit.    BP 130/88  Pulse 75  Temp(Src) 98.2 F (36.8 C) (Oral)  Ht 5' 2.5" (1.588 m)  Wt 267 lb 0.6 oz (121.129 kg)  BMI 48.03 kg/m2  SpO2 99%  LMP 07/30/2013        Objective:   Physical Exam  Physical Exam  Constitutional: She is oriented to person, place, and time. She appears well-developed and well-nourished. No distress.  HENT:  Head: Normocephalic and atraumatic.  Right Ear: Tympanic membrane and ear canal normal.  Left Ear: Tympanic membrane and ear canal normal.  Mouth/Throat: Oropharynx is clear and moist.  Eyes: Pupils are equal, round, and reactive to light. No scleral icterus.  Neck: Normal range of motion. No thyromegaly present.  Cardiovascular: Normal rate and regular rhythm.   No murmur heard. Pulmonary/Chest: Effort normal and breath sounds normal. No respiratory distress. He has no wheezes. She has no rales. She exhibits no tenderness.  Abdominal: Soft. Bowel sounds are normal. He exhibits no distension and no mass. There is no tenderness. There is no rebound and no guarding.  Musculoskeletal: She exhibits no edema.  Lymphadenopathy:    She has no cervical adenopathy.  Neurological: She is alert and oriented to person, place, and time. She has normal reflexes. She exhibits normal muscle tone. Coordination normal.  Skin: Skin is warm and dry.  Psychiatric: She has a normal mood and affect. Her behavior is normal. Judgment and thought content normal.  Breasts: Examined lying Right: Without masses, retractions, discharge or axillary adenopathy.  Left: Without masses, retractions, discharge or axillary adenopathy.  Pelvic exam performed with chaperone. Exam limited due to extreme patient  discomfort. Some thick white vaginal discharge is noted.          Assessment & Plan:         Assessment & Plan:

## 2013-08-25 NOTE — Assessment & Plan Note (Addendum)
We discussed healthy diet, exercise, weight loss. Pap not performed today. Follow up in 6 months- will re-attempt pap at that time.

## 2013-08-26 LAB — URINALYSIS, MICROSCOPIC ONLY
Bacteria, UA: NONE SEEN
Casts: NONE SEEN

## 2013-08-26 LAB — URINALYSIS, ROUTINE W REFLEX MICROSCOPIC
Bilirubin Urine: NEGATIVE
Glucose, UA: NEGATIVE mg/dL
Ketones, ur: NEGATIVE mg/dL
LEUKOCYTES UA: NEGATIVE
Nitrite: NEGATIVE
PROTEIN: NEGATIVE mg/dL
Specific Gravity, Urine: >1.03 — ABNORMAL HIGH (ref 1.005–1.030)
UROBILINOGEN UA: 0.2 mg/dL (ref 0.0–1.0)
pH: 7 (ref 5.0–8.0)

## 2013-08-26 LAB — CERVICOVAGINAL ANCILLARY ONLY
Chlamydia: NEGATIVE
Neisseria Gonorrhea: NEGATIVE
TRICH (WINDOWPATH): NEGATIVE

## 2013-08-27 ENCOUNTER — Telehealth: Payer: Self-pay | Admitting: *Deleted

## 2013-08-27 NOTE — Telephone Encounter (Signed)
Message copied by Kathi SimpersFERGERSON, Helaman Mecca A on Fri Aug 27, 2013  5:18 PM ------      Message from: O'SULLIVAN, MELISSA      Created: Thu Aug 26, 2013  9:31 PM       Can they add on candida and gardnerella to sample please? ------

## 2013-08-27 NOTE — Telephone Encounter (Signed)
Candida and gardnerella are on original order. Tried to call cytology but they are already gone for the day. Will try again on Monday.

## 2013-08-29 ENCOUNTER — Encounter: Payer: Self-pay | Admitting: Family

## 2013-08-30 NOTE — Telephone Encounter (Signed)
[facility]. She states it appears candida and gardnerella are still pending. She will check status and call us back.

## 2013-08-30 NOTE — Telephone Encounter (Signed)
[facility] stating that the machine that runs gardnerella has been broken and should be fixed by today. Since candida and gardnerella are run in the same dept. We will not be able to see either result until both tests are final. Corrie DandyMary gave me a verbal on candida as negative. States we should have gardnerella result by tomorrow afternoon.

## 2013-08-31 ENCOUNTER — Telehealth: Payer: Self-pay | Admitting: *Deleted

## 2013-08-31 MED ORDER — BENZACLIN 1-5 % EX GEL: Freq: Two times a day (BID) | CUTANEOUS | Status: DC

## 2013-08-31 NOTE — Telephone Encounter (Signed)
Rx sent. Left detailed message on home # and to call if any questions. 

## 2013-08-31 NOTE — Telephone Encounter (Signed)
OK to switch to Benzaclin apply daily to affected area. Disp #1 tube with 2 rf and see if she tolerates

## 2013-08-31 NOTE — Telephone Encounter (Signed)
Received call from pt that insurance is requiring prior authorization for benzamycin gel.  Haven't received fax from pharmacy yet.  Pt will call insurance to find out formulary alternatives. I spoke with pharmacist and she states that Medicaid will cover Brand name Benzaclin. Benzaclin has clindamycin instead of erythromycin.  Comes in 25g. Is it ok to change rx to Benzaclin?

## 2013-09-01 ENCOUNTER — Encounter: Payer: Self-pay | Admitting: Neurology

## 2013-09-01 ENCOUNTER — Ambulatory Visit (INDEPENDENT_AMBULATORY_CARE_PROVIDER_SITE_OTHER): Payer: 59 | Admitting: Neurology

## 2013-09-01 VITALS — BP 126/70 | HR 76 | Resp 16 | Ht 63.6 in | Wt 269.9 lb

## 2013-09-01 DIAGNOSIS — G43109 Migraine with aura, not intractable, without status migrainosus: Secondary | ICD-10-CM

## 2013-09-01 DIAGNOSIS — R51 Headache: Secondary | ICD-10-CM

## 2013-09-01 DIAGNOSIS — R29898 Other symptoms and signs involving the musculoskeletal system: Secondary | ICD-10-CM

## 2013-09-01 DIAGNOSIS — R200 Anesthesia of skin: Secondary | ICD-10-CM

## 2013-09-01 DIAGNOSIS — R209 Unspecified disturbances of skin sensation: Secondary | ICD-10-CM

## 2013-09-01 LAB — CERVICOVAGINAL ANCILLARY ONLY
BACTERIAL VAGINITIS: NEGATIVE
CANDIDA VAGINITIS: NEGATIVE

## 2013-09-01 MED ORDER — VERAPAMIL HCL 80 MG PO TABS: 80.0000 mg | ORAL_TABLET | Freq: Three times a day (TID) | ORAL | Status: AC

## 2013-09-01 MED ORDER — RIZATRIPTAN BENZOATE 10 MG PO TBDP: 10.0000 mg | ORAL_TABLET | ORAL | Status: AC | PRN

## 2013-09-01 NOTE — Telephone Encounter (Signed)
[facility] message returning your call, she states the results of the testing were negative, give her or Melanie Christian a call back with questions

## 2013-09-01 NOTE — Patient Instructions (Addendum)
1.  Start verapamil 80mg  three times daily.  Side effects may include lightheadedness or dizziness.  Call in 3-4 weeks with update. 2.  For acute migraine, take Maxalt 10mg .  May repeat once in 2 hours if needed. 3.  We will get MRI of the brain with and without contrast April [facility].  Follow up in 3 months.

## 2013-09-01 NOTE — Progress Notes (Signed)
NEUROLOGY FOLLOW UP OFFICE NOTE  GERARD BONUS [id number]  HISTORY OF PRESENT ILLNESS: Melanie Christian is a 28 year old right-handed woman with history of anemia, depression, Bipolar I disorder, GERD and obesity who presents for evaluation of migraine..  Records and images were personally reviewed where available.    UPDATE: Intensity:  8-10/10 Duration:  1 day Frequency:  1-2 times a week  Current abortive therapy:  Uses Advil Migraine first (which only takes the edge off), and then Imitrex 100mg  as second line (effective, but makes her nauseous and dizzy) Current preventative therapy:  Was taking propranolol 40mg  twice daily for a month but stopped because it made her dizzy and lightheaded. Takes Lamictal for mood.  Recently started on birth control due to hirsutism and increased testosterone.  She also reports episodes of numbness that has been ongoing for 5 years since giving birth to her son.  She will experience pins and needles sensation with an undescribable pain, involving any extremity, not in a dermatomal distribution.  She does not recall if it ever involves the face.  It would last all day and occurs infrequently but she cannot elaborate.  It occurs spontaneously.  She will take ibuprofen for the pain.  Sometimes, her knee will buckle.  She says this has been a problem since childhood and used to wear a knee brace.  No neck pain.  She reports that her half-sister has MS.  No prior history of vision loss.  HISTORY: Onset:  2006 Location: right sided > left-sided, periorbital to back of head, no neck pain Quality:  Dull to throbbing Initial intensity:  8/10 Associated symptoms:  Nausea, photophobia, phonophobia, osmophobia Aura:  Phantom smells, blurred vision Initial Duration:  2 days (1 hour with medications) Initial Frequency:  Once a week (last migraine 2 weeks ago) Activity:  Able to work if needed, but rather lay down Triggers/exacerbating factors:  Stress, smells  (coffee, perfume) Relieving factors:  Rest, dark  Past abortive therapy:  Excedrin migraine (ASA makes nauseous), Goody's, Flexeril Past preventative therapy:  Topiramate (not felt well).  Preventative medications that were used for depression: amitriptyline, Effexor (stopped due to bipolar)  Caffeine:  Coffee once a week Sleep hygiene:  Not too well now (stress from school) Stress/depression:  Increased stress from school and raising 34 year old daughter, but depression stable Family history of headache:  Mother, father (aneurysm)  He had a CT of the brain performed on 03/09/13, which was unremarkable.  The right occipital horn looked diminutive, which likely is a normal variant.  PAST MEDICAL HISTORY: Past Medical History  Diagnosis Date  . Anemia   . Depression   . GERD (gastroesophageal reflux disease)   . UTI (lower urinary tract infection)   . Obesities, morbid   . Migraines   . Bipolar 1 disorder   . Herpes   . Helicobacter pylori antibody positive     MEDICATIONS: Current Outpatient Prescriptions on File Prior to Visit  Medication Sig Dispense Refill  . BENZACLIN gel Apply topically 2 (two) times daily.  25 g  2  . benzoyl peroxide-erythromycin (BENZAMYCIN) gel Apply topically 2 (two) times daily.  23.3 g  2  . Ibuprofen (ADVIL MIGRAINE PO) Take 2 tablets by mouth as needed. Migraine.      . lamoTRIgine (LAMICTAL) 100 MG tablet Take 200 mg by mouth every morning. Take 1 tablet every morning and 2 tablets at bedtime.      . Norethindrone Acetate-Ethinyl Estradiol (JUNEL,LOESTRIN,MICROGESTIN) 1.5-30 MG-MCG  tablet Take 1 tablet by mouth daily.  1 Package  5  . omeprazole (PRILOSEC) 40 MG capsule Take 1 capsule (40 mg total) by mouth daily.  30 capsule  1  . valACYclovir (VALTREX) 1000 MG tablet Take 1 tablet (1,000 mg total) by mouth daily.  30 tablet  5  . amoxicillin (AMOXIL) 500 MG tablet Take 500 mg by mouth daily.       No current facility-administered medications on  file prior to visit.    ALLERGIES: Allergies  Allergen Reactions  . Aspirin Nausea Only  . Hydrocodone-Acetaminophen     REACTION: Dizzy, "loopy"  . Oxycodone-Acetaminophen     REACTION: itching  . Topamax [Topiramate]   . Ultracet [Tramadol-Acetaminophen] Other (See Comments)    hyperactivity    FAMILY HISTORY: Family History  Problem Relation Age of Onset  . Arthritis Mother   . Hyperlipidemia Mother   . Hypertension Mother   . Drug abuse Father   . Hypertension Father   . Depression Father     PTSD from [place]  . Polycystic ovary syndrome Sister   . Arthritis Maternal Grandmother   . Diabetes Maternal Grandmother   . Stomach cancer Maternal Grandmother   . Hypertension Paternal Grandmother   . Sickle cell trait Other 30  . Fibromyalgia Mother   . Colon cancer Neg Hx   . Colon polyps Maternal Uncle   . Colon polyps Maternal Grandfather   . Multiple sclerosis  21    SOCIAL HISTORY: History   Social History  . Marital Status: Single    Spouse Name: N/A    Number of Children: N/A  . Years of Education: N/A   Occupational History  . Not on file.   Social History Main Topics  . Smoking status: Never Smoker   . Smokeless tobacco: Never Used  . Alcohol Use: 1.0 oz/week    2 drink(s) per week     Comment: Only drinks once a month when she goes to a club. Has couple of drinks at a time.  . Drug Use: No  . Sexual Activity: Yes   Other Topics Concern  . Not on file   Social History Narrative   Studying biology at [employer]   Married- female partner   Regular exercise:  Zumba every other day   Caffeine Use:  Tea/soda 1-2 times a week             REVIEW OF SYSTEMS: Constitutional: No fevers, chills, or sweats, no generalized fatigue, change in appetite Eyes: No visual changes, double vision, eye pain Ear, nose and throat: No hearing loss, ear pain, nasal congestion, sore throat Cardiovascular: No chest pain, palpitations Respiratory:  No shortness of  breath at rest or with exertion, wheezes GastrointestinaI: No nausea, vomiting, diarrhea, abdominal pain, fecal incontinence Genitourinary:  No dysuria, urinary retention or frequency Musculoskeletal:  No neck pain, back pain Integumentary: No rash, pruritus, skin lesions Neurological: as above Psychiatric: No depression, insomnia, anxiety Endocrine: No palpitations, fatigue, diaphoresis, mood swings, change in appetite, change in weight, increased thirst Hematologic/Lymphatic:  No anemia, purpura, petechiae. Allergic/Immunologic: no itchy/runny eyes, nasal congestion, recent allergic reactions, rashes  PHYSICAL EXAM: Filed Vitals:   09/01/13 0806  BP: 126/70  Pulse: 76  Resp: 16   General: No acute distress Head:  Normocephalic/atraumatic Neck: supple, no paraspinal tenderness, full range of motion Heart:  Regular rate and rhythm Lungs:  Clear to auscultation bilaterally Back: No paraspinal tenderness Neurological Exam: alert and oriented to person, place,  and time. Attention span and concentration intact, recent and remote memory intact, fund of knowledge intact.  Speech fluent and not dysarthric, language intact.  CN II-XII intact. Fundoscopic exam unremarkable without vessel changes, exudates, hemorrhages or papilledema.  Bulk and tone normal, muscle strength 5/5 throughout.  Sensation to light touch, temperature and vibration intact.  Deep tendon reflexes 2+ throughout, toes downgoing.  Finger to nose and heel to shin testing intact.  Gait normal, Romberg negative.  IMPRESSION: Migraine  PLAN: 1.  Will start verapamil 80mg  three times daily for migraine preventative.  Side effects discussed.  She is to call in 3-4 weeks with update. 2.  Instead of Imitrex, we will try Maxalt 10mg  3.  MRI of brain with and without contrast to evaluate for transient numbness and leg weakness. 4.  Follow up in 3 months.  Shon MilletAdam Jaffe, DO  CC:  [name], NP

## 2013-09-20 ENCOUNTER — Ambulatory Visit (HOSPITAL_COMMUNITY)
Admission: RE | Admit: 2013-09-20 | Discharge: 2013-09-20 | Disposition: A | Discharge status: Home / Self Care | Payer: 59 | Source: Ambulatory Visit | Attending: Neurology | Admitting: Neurology

## 2013-09-20 DIAGNOSIS — G43909 Migraine, unspecified, not intractable, without status migrainosus: Secondary | ICD-10-CM | POA: Diagnosis not present

## 2013-09-20 DIAGNOSIS — D649 Anemia, unspecified: Secondary | ICD-10-CM | POA: Insufficient documentation

## 2013-09-20 DIAGNOSIS — R29898 Other symptoms and signs involving the musculoskeletal system: Secondary | ICD-10-CM

## 2013-09-20 DIAGNOSIS — R51 Headache: Secondary | ICD-10-CM

## 2013-09-20 DIAGNOSIS — R200 Anesthesia of skin: Secondary | ICD-10-CM

## 2013-09-20 MED ORDER — GADOBENATE DIMEGLUMINE 529 MG/ML IV SOLN
20.0000 mL | Freq: Once | INTRAVENOUS | Status: AC
  Administered 2013-09-20: 20 mL via INTRAVENOUS

## 2013-09-27 ENCOUNTER — Telehealth: Payer: Self-pay | Admitting: *Deleted

## 2013-09-27 NOTE — Telephone Encounter (Signed)
MRI Melanie Christian is normal patient was notified

## 2013-10-12 ENCOUNTER — Telehealth: Payer: Self-pay | Admitting: *Deleted

## 2013-10-12 MED ORDER — ADAPALENE-BENZOYL PEROXIDE 0.1-2.5 % EX GEL: CUTANEOUS | Status: DC

## 2013-10-12 NOTE — Telephone Encounter (Signed)
Stop benzamycin gel, start Epiduo gel. Continue Gildess for now.

## 2013-10-12 NOTE — Telephone Encounter (Signed)
Received message from pt that she doesn't feel Gildess is helping her acne and other symptoms. Wants to know if there is another alternative she can try?  Please advise.

## 2013-10-13 ENCOUNTER — Telehealth: Payer: Self-pay | Admitting: Family

## 2013-10-13 NOTE — Telephone Encounter (Signed)
Left detailed message on pt's voice mail

## 2013-10-13 NOTE — Telephone Encounter (Signed)
Form completed and faxed to [phone]. Awaiting approval / denial status.

## 2013-10-13 NOTE — Telephone Encounter (Signed)
Received paperwork for PA on Epi, forward paperwork to nurse

## 2013-10-14 ENCOUNTER — Other Ambulatory Visit: Payer: Self-pay | Admitting: Family

## 2013-10-14 NOTE — Telephone Encounter (Signed)
Patient called in stating that she has medicaid and that medicaid has a new system and her psychiatrist is not registered yet with medicaid. She would like to know if Melissa would refill lamictal until her psychiatrist is registered with medicaid. [facility] on [address].

## 2013-10-15 MED ORDER — LAMOTRIGINE 100 MG PO TABS: 200.0000 mg | ORAL_TABLET | Freq: Every morning | ORAL | Status: DC

## 2013-10-15 NOTE — Telephone Encounter (Signed)
Verified with pt that she takes 2 tablets once a day and tablets are 100mg  each. Refill sent.

## 2013-10-15 NOTE — Telephone Encounter (Signed)
OK to send 1 month supply with 1 refill. Please verify dosing instructions with pt as it is not clear in our EMR how she is taking.

## 2013-10-26 MED ORDER — CLINDAMYCIN PHOSPHATE 1 % EX GEL: Freq: Two times a day (BID) | CUTANEOUS | Status: AC

## 2013-10-26 NOTE — Telephone Encounter (Signed)
Left detailed message on voicemail and to call if any questions. 

## 2013-10-26 NOTE — Telephone Encounter (Signed)
Try clindamycin gel instead. Rx sent to pharmacy.

## 2013-10-26 NOTE — Telephone Encounter (Signed)
Received fax from [facility] that Epiduo gel was denied because pt has not tried and failed 2 formulary alternatives. Pt has tried benzaclin.  Other formulary alternatives are:  Azelex, clindamycin gel, lotion, plegets or solution, Differin or tretinoin.  Please advise if one of these would be appropriate alternative?

## 2013-11-22 ENCOUNTER — Ambulatory Visit: Payer: 59 | Admitting: Family

## 2013-11-29 ENCOUNTER — Telehealth: Payer: Self-pay | Admitting: Neurology

## 2013-11-29 NOTE — Telephone Encounter (Signed)
Notes / Sherri

## 2013-11-29 NOTE — Telephone Encounter (Signed)
Message copied by Griffin DakinSANDS, SHERRI R on Mon Nov 29, 2013  3:30 PM ------      Message from: Shary DecampLOYE, ANDREA S      Created: Mon Nov 29, 2013  3:02 PM      Regarding: Pt canceled 12/01/13 f/u appt        Pt called to cancel her 12/01/13 appt. She did not state why but she say she will call back to r/s  ------

## 2013-12-01 ENCOUNTER — Ambulatory Visit: Payer: 59 | Admitting: Neurology

## 2014-01-10 ENCOUNTER — Other Ambulatory Visit: Payer: Self-pay | Admitting: Family

## 2014-01-11 NOTE — Telephone Encounter (Signed)
Rx sent to pharmacy. Pt will be due for 6 mth f/u 9/25. Please schedule appt. LDM

## 2014-01-12 NOTE — Telephone Encounter (Signed)
Left detailed message informing patient of medication refill and to schedule follow up appointment after 9/25

## 2014-01-13 NOTE — Telephone Encounter (Signed)
Left message for patient to return my call.

## 2014-01-14 NOTE — Telephone Encounter (Signed)
Please mail letter to pt to call and arrange appointment after 02/25/14 for routine follow up.

## 2014-01-14 NOTE — Telephone Encounter (Signed)
Left message for patient to return my call.

## 2014-01-18 ENCOUNTER — Encounter: Payer: Self-pay | Admitting: Family

## 2014-01-18 NOTE — Telephone Encounter (Signed)
Letter sent.

## 2014-02-01 ENCOUNTER — Other Ambulatory Visit: Payer: Self-pay | Admitting: Family

## 2014-02-02 NOTE — Telephone Encounter (Signed)
Gildess refill sent to pharmacy. Pt last seen by [place] in March and advised 6 month follow up. Please call pt to arrange appt on 02/25/14 or after.

## 2014-02-02 NOTE — Telephone Encounter (Signed)
Left detailed message informing patient of this. °

## 2014-02-03 NOTE — Telephone Encounter (Signed)
Left message for patient to return my call.

## 2014-02-04 IMAGING — US US ABDOMEN COMPLETE
1 series · 14 of 25 positions shown · non-contrast
Comparison: CT 05/11/2008

CLINICAL DATA: Abdominal pain.  Rule out mass.

EXAM:
ULTRASOUND ABDOMEN COMPLETE

[Series 1: us abdomen complete · 0.35mm/px · 14 of 69 slices shown]
[im 1/69]
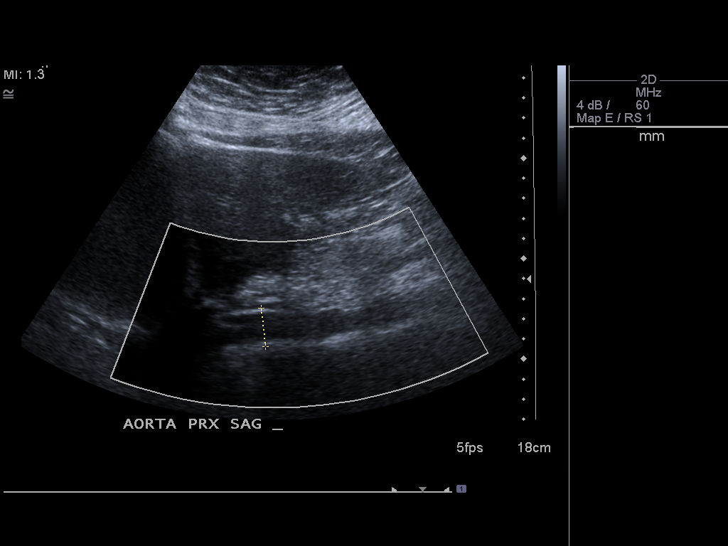
[im 6/69]
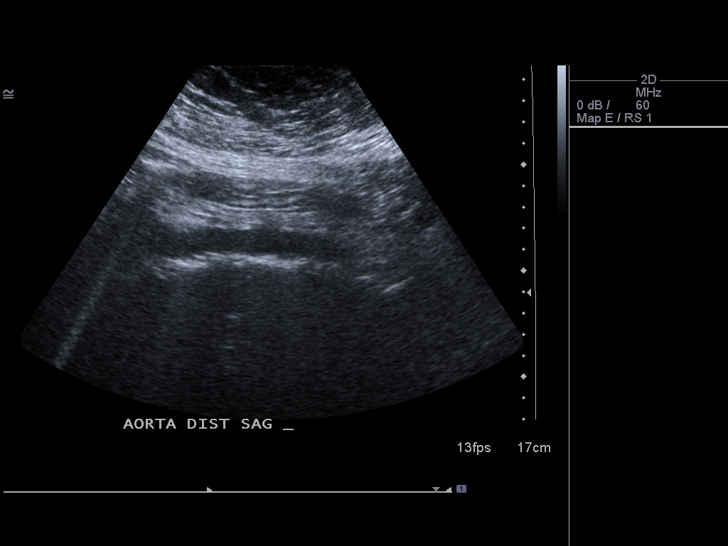
[im 12/69]
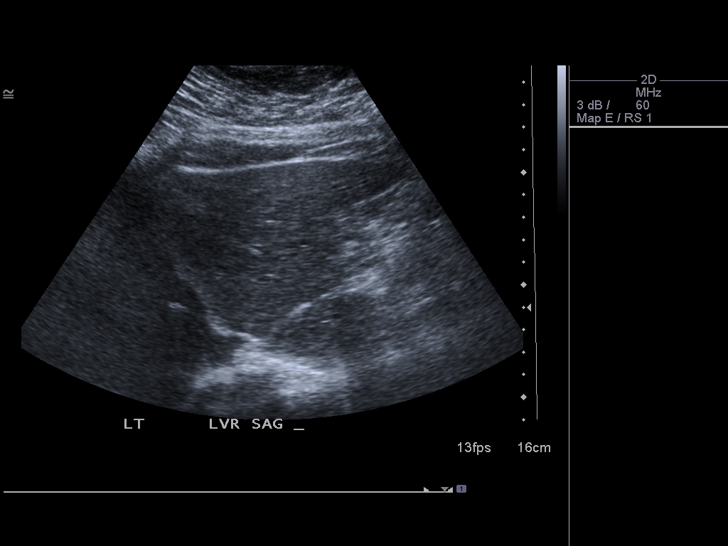
[im 18/69]
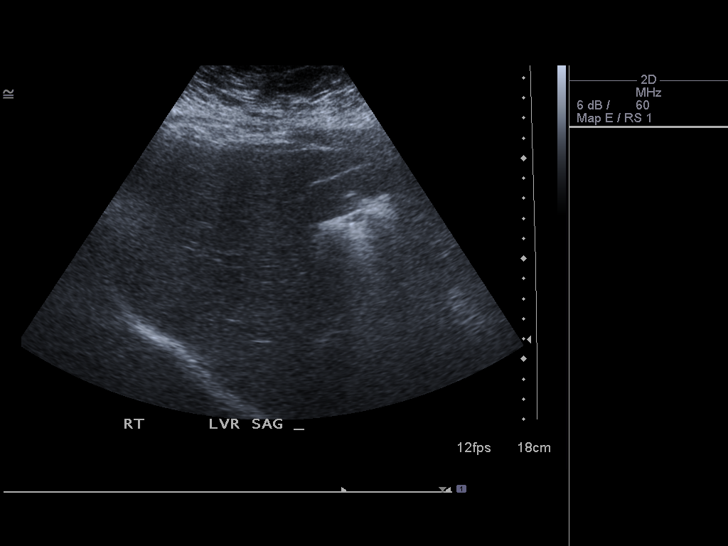
[im 23/69]
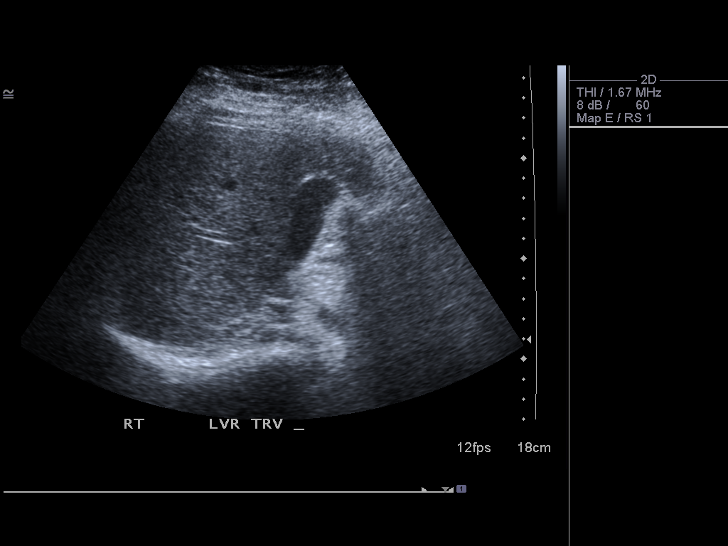
[im 26/69]
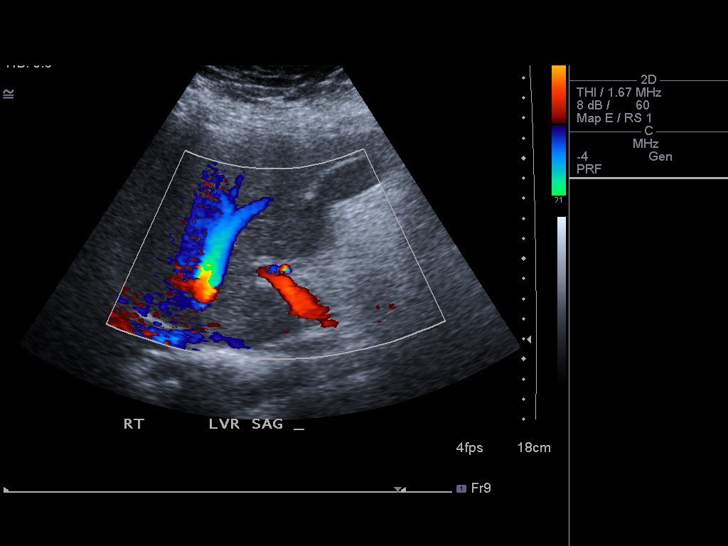
[im 32/69]
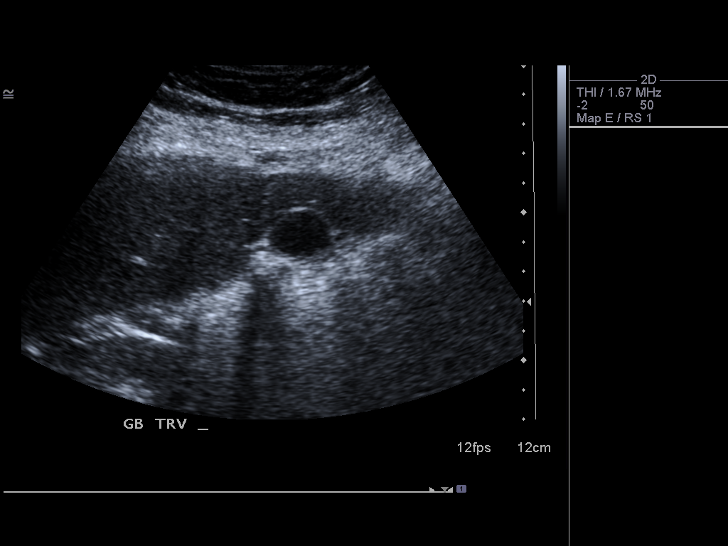
[im 37/69]
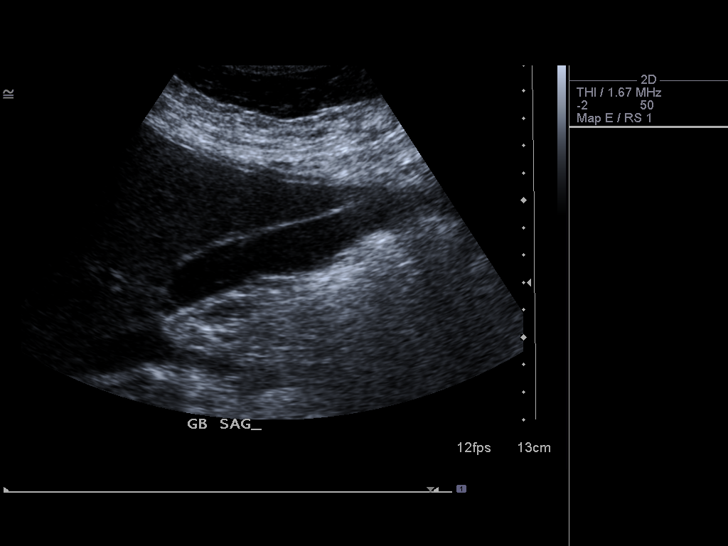
[im 43/69]
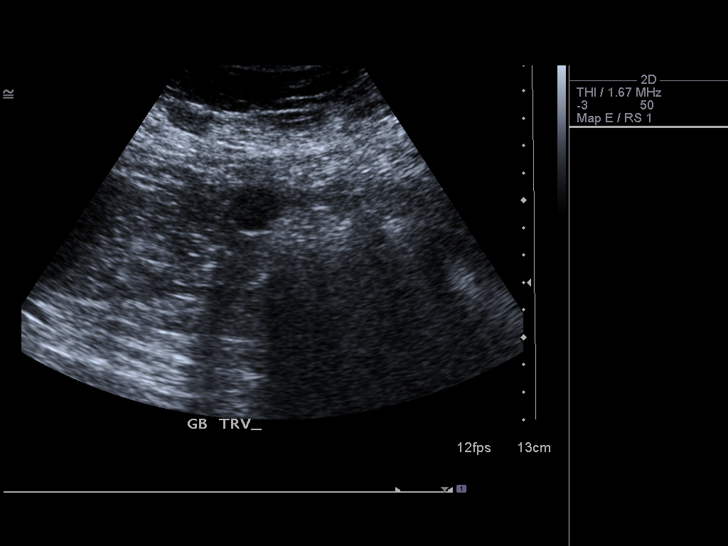
[im 46/69]
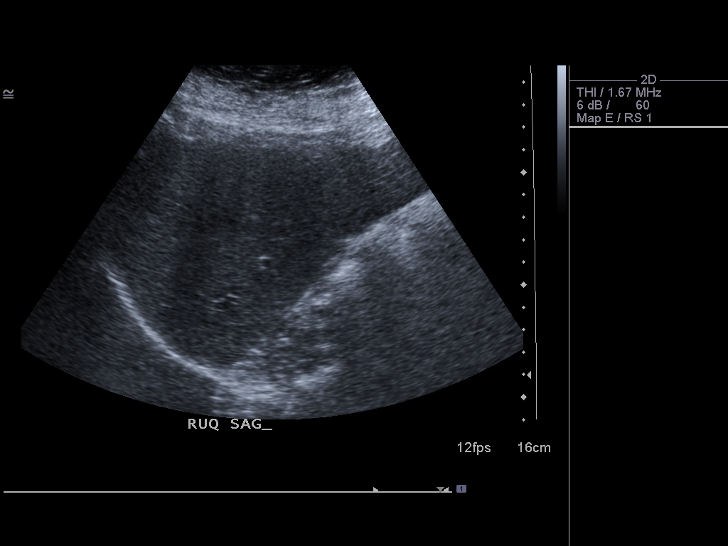
[im 52/69]
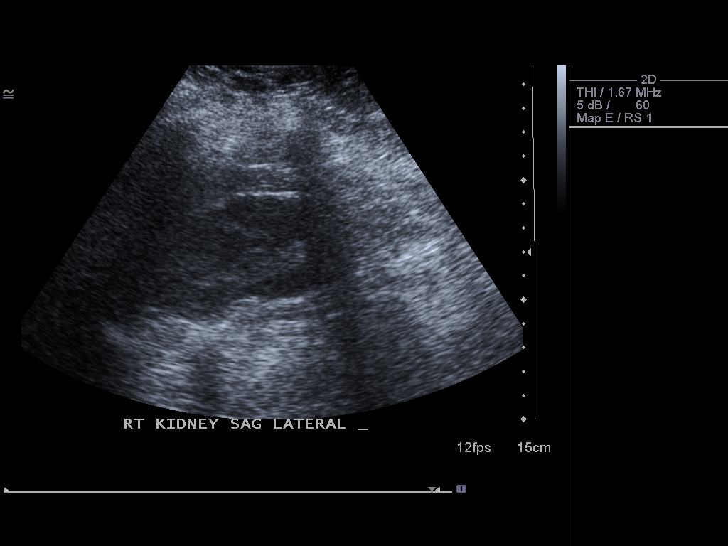
[im 57/69]
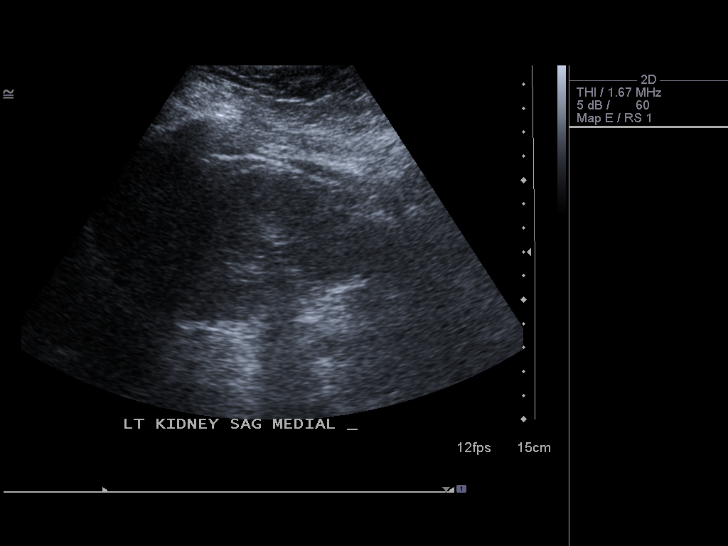
[im 63/69]
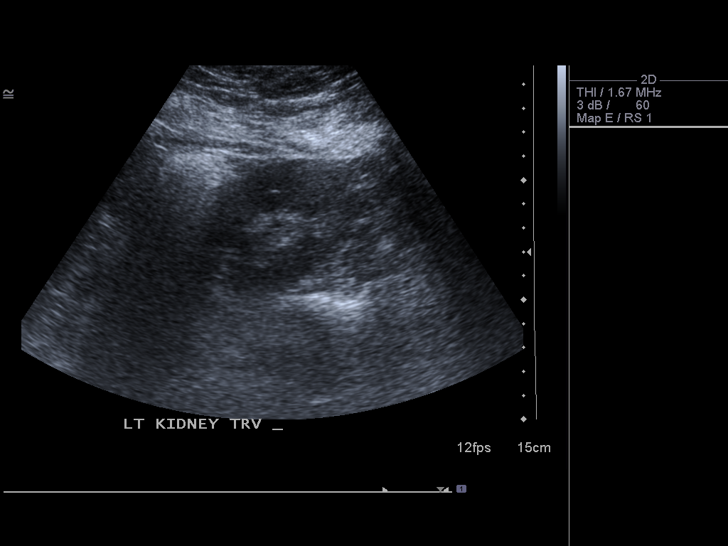
[im 69/69]
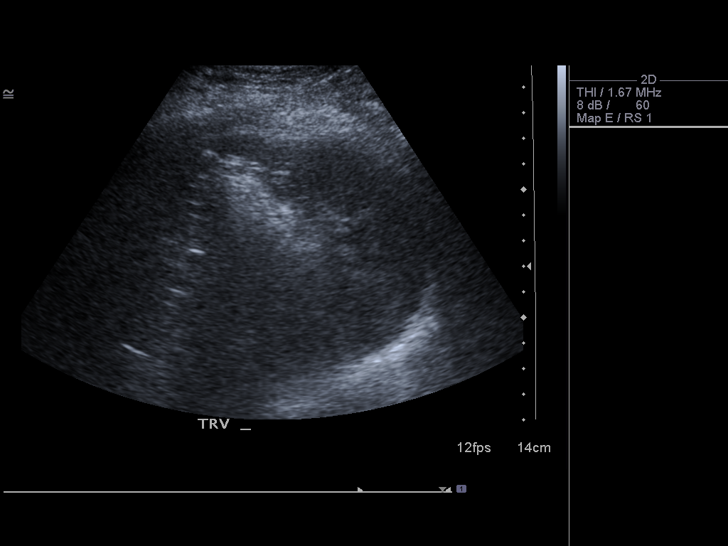

[14 of 25 positions shown; findings below may reference images not displayed]

FINDINGS: Gallbladder

No gallstones or wall thickening visualized. No sonographic Murphy
sign noted.

Common bile duct

Diameter: 2.0 mm

Liver

No focal lesion identified. Within normal limits in parenchymal
echogenicity.

IVC

Limited

Pancreas

Limited

Spleen

Size and appearance within normal limits.

Right Kidney

Length: 10.9 cm. Echogenicity within normal limits. No mass or
hydronephrosis visualized.

Left Kidney

Length: 11.3 cm.. Echogenicity within normal limits. No mass or
hydronephrosis visualized.

Abdominal aorta

No aneurysm visualized.
IMPRESSION: Negative for gallstones.  No acute abnormality.

## 2014-03-06 ENCOUNTER — Other Ambulatory Visit: Payer: Self-pay | Admitting: Family

## 2014-03-07 NOTE — Telephone Encounter (Signed)
Refill sent per [facility] refill protocol/SLS Requested drug refills are authorized, however, the patient needs further evaluation and/or laboratory testing before further refills are given. Ask her to make an appointment for this.  

## 2015-08-19 ENCOUNTER — Encounter (HOSPITAL_BASED_OUTPATIENT_CLINIC_OR_DEPARTMENT_OTHER): Payer: Self-pay

## 2015-08-19 ENCOUNTER — Emergency Department (HOSPITAL_BASED_OUTPATIENT_CLINIC_OR_DEPARTMENT_OTHER)
Admission: EM | Admit: 2015-08-19 | Discharge: 2015-08-19 | Disposition: A | Discharge status: Home / Self Care | Payer: Managed Care, Other (non HMO) | Attending: Emergency Medicine | Admitting: Emergency Medicine

## 2015-08-19 DIAGNOSIS — Z87440 Personal history of urinary (tract) infections: Secondary | ICD-10-CM | POA: Insufficient documentation

## 2015-08-19 DIAGNOSIS — F319 Bipolar disorder, unspecified: Secondary | ICD-10-CM | POA: Insufficient documentation

## 2015-08-19 DIAGNOSIS — B86 Scabies: Secondary | ICD-10-CM | POA: Insufficient documentation

## 2015-08-19 DIAGNOSIS — Z792 Long term (current) use of antibiotics: Secondary | ICD-10-CM | POA: Diagnosis not present

## 2015-08-19 DIAGNOSIS — Z79899 Other long term (current) drug therapy: Secondary | ICD-10-CM | POA: Diagnosis not present

## 2015-08-19 DIAGNOSIS — R21 Rash and other nonspecific skin eruption: Secondary | ICD-10-CM | POA: Diagnosis present

## 2015-08-19 MED ORDER — PERMETHRIN 5 % EX CREA: TOPICAL_CREAM | CUTANEOUS | Status: AC

## 2015-08-19 MED ORDER — HYDROXYZINE HCL 25 MG PO TABS: 25.0000 mg | ORAL_TABLET | Freq: Three times a day (TID) | ORAL | Status: AC | PRN

## 2015-08-19 NOTE — ED Notes (Signed)
Pt reports rash to bilateral arms, chest, back, states areas itch - reports Hydrocortisone cream provided no relief, Benadryl pills "just put me to sleep."

## 2015-08-19 NOTE — ED Provider Notes (Signed)
CSN: [id number]     Arrival date & time 08/19/15  1557 History  By signing my name below, I, [facility] Christian, attest that this documentation has been prepared under the direction and in the presence of Marily MemosJason Juandavid Dallman, MD. Electronically Signed: Randell PatientMarrissa Christian, ED Scribe. 08/19/2015. 6:53 PM.   Chief Complaint  Patient presents with  . Rash    The history is provided by the patient. No language interpreter was used.  HPI Comments: Melanie Christian is a 30 y.o. female who presents to the Emergency Department complaining of a mildly pruritic, unchanging rash to her bilateral arms, chest and back onset in the past week. Patient reports that she and her wife stayed in a new place one week ago but is unsure if she was bitten by any insects at this time. She states that she shares a bed with her wife and children who are all asymptomatic. She has taken Benadryl without relief. She states that she has purchased clothes recently. Denies recent environmental exposure or changes in soaps and detergents. Denies contact with individuals with similar symptoms. Denies any other symptoms currently.   Past Medical History  Diagnosis Date  . Anemia   . Depression   . GERD (gastroesophageal reflux disease)   . UTI (lower urinary tract infection)   . Obesities, morbid (HCC)   . Migraines   . Bipolar 1 disorder (HCC)   . Herpes   . Helicobacter pylori antibody positive    Past Surgical History  Procedure Laterality Date  . Tonsillectomy  1996  . Cesarean section  04/2008  . Dilation and curettage of uterus  01/2007  . Induced abortion  11/22/09    elective (patient prefers nobody know this!- please do not call out as part of medical history!)  . Breast reduction surgery  12/13   Family History  Problem Relation Age of Onset  . Arthritis Mother   . Hyperlipidemia Mother   . Hypertension Mother   . Drug abuse Father   . Hypertension Father   . Depression Father     PTSD from [place]  .  Polycystic ovary syndrome Sister   . Arthritis Maternal Grandmother   . Diabetes Maternal Grandmother   . Stomach cancer Maternal Grandmother   . Hypertension Paternal Grandmother   . Sickle cell trait Other 30  . Fibromyalgia Mother   . Colon cancer Neg Hx   . Colon polyps Maternal Uncle   . Colon polyps Maternal Grandfather   . Multiple sclerosis  21   Social History  Substance Use Topics  . Smoking status: Never Smoker   . Smokeless tobacco: Never Used  . Alcohol Use: 1.0 oz/week    2 drink(s) per week     Comment: Only drinks once a month when she goes to a club. Has couple of drinks at a time.   OB History    No data available     Review of Systems  Skin: Positive for color change (erythema to areas with rash) and rash (torso, arms, hands).      Allergies  Aspirin; Hydrocodone-acetaminophen; Oxycodone-acetaminophen; Topamax; and Ultracet  Home Medications   Prior to Admission medications   Medication Sig Start Date End Date Taking? Authorizing Provider  ARIPiprazole (ABILIFY) 2 MG tablet Take 2 mg by mouth daily.   Yes Historical Provider, MD  divalproex (DEPAKOTE) 500 MG DR tablet Take 1,500 mg by mouth at bedtime.   Yes Historical Provider, MD  Ibuprofen (ADVIL MIGRAINE PO) Take 2 tablets  by mouth as needed. Migraine.   Yes Historical Provider, MD  clindamycin (CLINDAGEL) 1 % gel Apply topically 2 (two) times daily. Apply thin film to face 1-2 times daily 10/26/13   Sandford Craze, NP  Antony Contras 1.5/30 1.5-30 MG-MCG tablet take 1 tablet by mouth once daily 02/02/14   Sandford Craze, NP  hydrOXYzine (ATARAX/VISTARIL) 25 MG tablet Take 1 tablet (25 mg total) by mouth every 8 (eight) hours as needed. 08/19/15   Marily Memos, MD  lamoTRIgine (LAMICTAL) 100 MG tablet take 2 tablets by mouth every morning 03/07/14   Sandford Craze, NP  omeprazole (PRILOSEC) 40 MG capsule Take 1 capsule (40 mg total) by mouth daily. 06/25/13   Amy S Esterwood, PA-C  permethrin  (ELIMITE) 5 % cream Apply to whole body once 08/19/15   Marily Memos, MD  rizatriptan (MAXALT-MLT) 10 MG disintegrating tablet Take 1 tablet (10 mg total) by mouth as needed for migraine. May repeat in 2 hours if needed 09/01/13   Drema Dallas, DO  valACYclovir (VALTREX) 1000 MG tablet Take 1 tablet (1,000 mg total) by mouth daily. 06/16/13   Sandford Craze, NP  verapamil (CALAN) 80 MG tablet Take 1 tablet (80 mg total) by mouth 3 (three) times daily. 09/01/13   Drema Dallas, DO   BP 109/45 mmHg  Pulse 106  Temp(Src) 99 F (37.2 C) (Oral)  Resp 16  Ht  (1.549 m)  Wt 297 lb (134.718 kg)  BMI 56.15 kg/m2  SpO2 98%  LMP 06/19/2015 Physical Exam  Constitutional: She is oriented to person, place, and time. She appears well-developed and well-nourished. No distress.  HENT:  Head: Normocephalic and atraumatic.  Eyes: Conjunctivae and EOM are normal.  Neck: Neck supple. No tracheal deviation present.  Cardiovascular: Normal rate.   Pulmonary/Chest: Effort normal. No respiratory distress.  Musculoskeletal: Normal range of motion.  Neurological: She is alert and oriented to person, place, and time.  Skin: Skin is warm and dry. Rash noted.  Multiple areas of erythema with papules. Multiple areas with lesions in a row on hands, torso, and arms. Excoriated.  Psychiatric: She has a normal mood and affect. Her behavior is normal.  Nursing note and vitals reviewed.   ED Course  Procedures   DIAGNOSTIC STUDIES: Oxygen Saturation is 98% on RA, normal by my interpretation.    COORDINATION OF CARE: 6:27 PM Discussed treatment plan with pt at bedside and pt agreed to plan.   Labs Review Labs Reviewed - No data to display  Imaging Review No results found. I have personally reviewed and evaluated these images and lab results as part of my medical decision-making.   EKG Interpretation None      MDM   Final diagnoses:  Scabies    Rash c/w scabies. Will rx permethrin for patient  and family. Instructions given for home cleaning as she is unsure of the source.   New Prescriptions: Discharge Medication List as of 08/19/2015  6:53 PM    START taking these medications   Details  hydrOXYzine (ATARAX/VISTARIL) 25 MG tablet Take 1 tablet (25 mg total) by mouth every 8 (eight) hours as needed., Starting 08/19/2015, Until Discontinued, Print    permethrin (ELIMITE) 5 % cream Apply to whole body once, Print         I have personally and contemperaneously reviewed labs and imaging and used in my decision making as above.   A medical screening exam was performed and I feel the patient has had an appropriate  workup for their chief complaint at this time and likelihood of emergent condition existing is low. Their vital signs are stable. They have been counseled on decision, discharge, follow up and which symptoms necessitate immediate return to the emergency department.  They verbally stated understanding and agreement with plan and discharged in stable condition.   I personally performed the services described in this documentation, which was scribed in my presence. The recorded information has been reviewed and is accurate.    Marily Memos, MD 08/19/15 [phone]
